# Patient Record
Sex: Female | Born: 1970 | Hispanic: No | Marital: Married
Health system: Midwestern US, Academic
[De-identification: ages and names within clinical notes are randomized; demographics above are authoritative.]

---

## 2017-02-19 MED ORDER — AZELASTINE 137 MCG (0.1 %) NA SPRA
2 | Freq: Two times a day (BID) | NASAL | 6 refills | 50.00000 days | Status: DC
Start: 2017-02-19 — End: 2018-08-12

## 2017-04-30 ENCOUNTER — Encounter: Admit: 2017-04-30 | Discharge: 2017-04-30 | Payer: BC Managed Care – PPO

## 2017-04-30 ENCOUNTER — Ambulatory Visit: Admit: 2017-04-30 | Discharge: 2017-04-30 | Payer: BC Managed Care – PPO

## 2017-04-30 DIAGNOSIS — K219 Gastro-esophageal reflux disease without esophagitis: ICD-10-CM

## 2017-04-30 DIAGNOSIS — G478 Other sleep disorders: ICD-10-CM

## 2017-04-30 DIAGNOSIS — K210 Gastro-esophageal reflux disease with esophagitis: ICD-10-CM

## 2017-04-30 DIAGNOSIS — J3081 Allergic rhinitis due to animal (cat) (dog) hair and dander: ICD-10-CM

## 2017-04-30 DIAGNOSIS — Z923 Personal history of irradiation: ICD-10-CM

## 2017-04-30 DIAGNOSIS — R51 Headache: ICD-10-CM

## 2017-04-30 DIAGNOSIS — E162 Hypoglycemia, unspecified: ICD-10-CM

## 2017-04-30 DIAGNOSIS — K2210 Ulcer of esophagus without bleeding: ICD-10-CM

## 2017-04-30 DIAGNOSIS — J3089 Other allergic rhinitis: Secondary | ICD-10-CM

## 2017-04-30 DIAGNOSIS — C50911 Malignant neoplasm of unspecified site of right female breast: Principal | ICD-10-CM

## 2017-04-30 DIAGNOSIS — J301 Allergic rhinitis due to pollen: Principal | ICD-10-CM

## 2017-04-30 NOTE — Progress Notes
Date of Service: 04/30/2017    Subjective:             Victoria Mcknight is a 46 y.o. female.    History of Present Illness    Unfortunately she has failed aggressive medical management for chronic rhinitis.    Allergy testing revealed reactions to trees, weeds, several molds, dust mite, cat, and cockroach.       Review of Systems   HENT: Positive for congestion, postnasal drip, rhinorrhea and sneezing.    Eyes: Negative.    Respiratory: Negative.    Cardiovascular: Negative.    Gastrointestinal: Negative.    Endocrine: Negative.    Genitourinary: Negative.    Musculoskeletal: Negative.    Skin: Positive for rash.   Allergic/Immunologic: Negative.    Neurological: Negative.    Hematological: Negative.    Psychiatric/Behavioral: Negative.          Objective:         ??? amoxicillin/K clavulanate (AUGMENTIN) 875/125 mg tablet Take 1 tablet by mouth every 12 hours. Take with food.   ??? azelastine(+) (ASTELIN) 137 mcg (0.1 %) nasal spray Apply 2 sprays to each nostril as directed twice daily. Use in each nostril as directed   ??? butalbital/acetaminophen/caffeine(+) (FIORICET) 50/325/40 mg tablet Take 1 Tab by mouth every 4 hours as needed for Headache.   ??? calcium citrate (CALCITRATE) 950 mg tab Take 950 mg by mouth daily.   ??? cetirizine (ZYRTEC) 10 mg tablet Take 10 mg by mouth at bedtime daily.   ??? ciprofloxacin 0.3%/dexameth 0.1% (CIPRODEX) 0.3-0.1 % otic suspension Place 4 drops in affected ear(s) as directed twice daily.   ??? dexlansoprazole (+) (DEXILANT) 60 mg capsule Take 60 mg by mouth daily.   ??? docusate (COLACE) 100 mg capsule Take 100 mg by mouth daily.   ??? Doxycycline Monohydrate (VIBRAMYCIN) 25 mg/5 mL susr Take  by mouth.   ??? erythromycin (E-MYCIN) 250 mg tablet Take 250 mg by mouth three times daily.   ??? fluticasone (FLONASE) 50 mcg/actuation nasal spray Apply  to each nostril as directed daily. Shake bottle gently before using.   ??? Glucosamine-Chondroit-Vit C-Mn (GLUCOSAMINE CHONDROITIN MAXSTR) 500-400 mg cap Take 1 Cap by mouth daily.   ??? ipratropium bromide (ATROVENT) 0.06 % nasal spray Apply 2 Sprays to each nostril as directed three times daily.   ??? Lactobacillus rhamnosus GG (LACTOBACILLUS RHAMNOSUS (GG)) 15 billion cell cpSP Take 1 Cap by mouth as directed daily with breakfast.   ??? mupirocin (BACTROBAN) 2 % topical ointment Apply  topically to affected area three times daily.   ??? PREDNISONE PO Take  by mouth.   ??? promethazine (PHENERGAN) 12.5 mg tablet Take 12.5 mg by mouth every 6 hours as needed for Nausea.   ??? tamoxifen (NOLVADEX) 20 mg tablet TAKE ONE TABLET BY MOUTH ONCE DAILY   ??? TETRAHYDROZOLINE HCL/ZINC SULF (EYE DROPS OP) Place  into or around eye(s).   ??? vitamin E 400 unit capsule Take 400 Units by mouth daily.   ??? vitamins, multiple tablet Take 1 Tab by mouth daily.     Vitals:    04/30/17 1247   BP: 111/71   Pulse: 71   Weight: 57.6 kg (127 lb)   Height: 163.8 cm (64.5)     Body mass index is 21.46 kg/m???.     Physical Exam    General appearance: well developed, well nourished, no acute distress  Communication ability: communicates by voice, normal quality  Inspection: normocephalic, no scars, lesions, or  masses  External ear: normal, no lesions or deformities  Otoscopic: canals clear, tympanic membranes intact, no fluid  Hearing: grossly intact  External nose: normal, no lesions or deformities  Palp/percussion: no sinus tenderness  Nasal: mucosa, septum, and turbinates normal  Lips/teeth/gums: normal dentition, no gingival inflammation, no labial lesions  Oropharynx: tongue normal, posterior pharynx without erythema or exudate  Pharynx wall/sinus: no saliva pooling, asymmetry, or lesions  The allergy testing site shows resolving wheal and flare responses.  No severe local or systemic reactions are noted.        Assessment and Plan:  Victoria Mcknight has significant allergy reactions on testing today.  Having failed aggressive medical therapy, I believe that she is an excellent candidate for immunotherapy.  We talked about the risks of treatment including anaphylaxis and I have prescribed an epinephrine pen today.      I reviewed that this is a 3-5 year treatment plan and that symptom improvement may take up to a year.  She is ready to proceed with injection immunotherapy.  She was carefully instructed on injection immunotherapy administration and the need for build up injections to be done in a physician's office due to the risk of anaphylaxis.  Victoria Mcknight will follow up next week for her first immunotherapy injection.

## 2017-05-04 ENCOUNTER — Encounter: Admit: 2017-05-04 | Discharge: 2017-05-04 | Payer: BC Managed Care – PPO

## 2017-05-06 ENCOUNTER — Encounter: Admit: 2017-05-06 | Discharge: 2017-05-06 | Payer: BC Managed Care – PPO

## 2017-05-06 DIAGNOSIS — J301 Allergic rhinitis due to pollen: Principal | ICD-10-CM

## 2017-05-06 MED ORDER — EPINEPHRINE 0.3 MG/0.3 ML IJ ATIN
INTRAMUSCULAR | 1 refills | 30.00000 days | Status: AC
Start: 2017-05-06 — End: ?

## 2017-05-06 NOTE — Progress Notes
Prescription for Epipen was sent to pharmacy. Called and left message for patient to call office back.

## 2017-05-08 ENCOUNTER — Encounter: Admit: 2017-05-08 | Discharge: 2017-05-08 | Payer: BC Managed Care – PPO

## 2017-05-08 ENCOUNTER — Ambulatory Visit: Admit: 2017-05-08 | Discharge: 2017-05-08 | Payer: BC Managed Care – PPO

## 2017-05-08 DIAGNOSIS — J301 Allergic rhinitis due to pollen: Principal | ICD-10-CM

## 2017-05-08 MED ORDER — IPRATROPIUM BROMIDE 42 MCG (0.06 %) NA SPRY
2 | Freq: Three times a day (TID) | NASAL | 12 refills | 25.00000 days | Status: AC
Start: 2017-05-08 — End: 2018-08-12

## 2017-05-08 NOTE — Telephone Encounter
Patient is needing a refill on her ipratropium bromide.  Ok to refill per office protocol.

## 2017-05-09 ENCOUNTER — Encounter: Admit: 2017-05-09 | Discharge: 2017-05-09 | Payer: BC Managed Care – PPO

## 2017-07-11 ENCOUNTER — Encounter: Admit: 2017-07-11 | Discharge: 2017-07-11 | Payer: BC Managed Care – PPO

## 2017-07-11 DIAGNOSIS — Z79810 Long term (current) use of selective estrogen receptor modulators (SERMs): ICD-10-CM

## 2017-07-11 DIAGNOSIS — C50411 Malignant neoplasm of upper-outer quadrant of right female breast: Principal | ICD-10-CM

## 2017-07-11 DIAGNOSIS — Z170 Estrogen receptor positive status [ER+]: ICD-10-CM

## 2017-07-11 DIAGNOSIS — G478 Other sleep disorders: ICD-10-CM

## 2017-07-11 DIAGNOSIS — K210 Gastro-esophageal reflux disease with esophagitis: ICD-10-CM

## 2017-07-11 DIAGNOSIS — R51 Headache: ICD-10-CM

## 2017-07-11 DIAGNOSIS — E162 Hypoglycemia, unspecified: ICD-10-CM

## 2017-07-11 DIAGNOSIS — Z9189 Other specified personal risk factors, not elsewhere classified: ICD-10-CM

## 2017-07-11 DIAGNOSIS — C50911 Malignant neoplasm of unspecified site of right female breast: Principal | ICD-10-CM

## 2017-07-11 DIAGNOSIS — Z923 Personal history of irradiation: ICD-10-CM

## 2017-07-11 DIAGNOSIS — K219 Gastro-esophageal reflux disease without esophagitis: ICD-10-CM

## 2017-07-11 DIAGNOSIS — K2210 Ulcer of esophagus without bleeding: ICD-10-CM

## 2017-07-11 NOTE — Progress Notes
LYMPHEDEMA DATASHEET-FOLLOW-UP    DATE:  07/11/17    PATIENT NAME:  Victoria Mcknight  DATE OF BIRTH:  Apr 10, 1971  MRN:   [id number]     Follow-Up Visit:??? 24 months  Patient Completed Questionnaire?:??? No  Lymphedema Diagnosis?:??? No  ???  LYMPHEDEMA INSTRUMENT:  ????????????????????????????????????PATIENT POPULATION:  ????????????????????????????????????????????????????????????????????????Diagnosed with cancer at [facility]???? Yes  ????????????????????????????????????????????????????????????????????????Treated for cancer at [facility]???? Yes-radiation tx in [place], [place]-Dr Goins  ????????????????????????????????????????????????????????????????????????Referred to [facility] after outside cancer treatment???? No  ????????????????????????????????????????????????????????????????????????Referred to [facility] for lymphedema management???? no  ???  ????????????????????????????????????DIAGNOSTIC DEFINITION OF LYMPHEDEMA:??? ?????????????????????????????????  Other:??? ???????????????????????????????????????????????????????????????????????????  ???  ??????  IMPACT OF LE ON ADL:  ????????????????????????????????????CHANGES IN BMI (UPON LE DIAGNOSIS):??? Stable   ????????????????????????????????????CHANGES IN ACTIVITY (CHECK ALL THAT APPLY):??? Right rsl lumpectomy SLNB (0/3) on 07/08/15  ????????????????????????????????????RECENT INFECTIONS?:??? No  ????????????????????????????????????TYPE OF INFECTION(S):??? none  ????????????????????????????????????CHANGE IN PRESCRIBED MEDICATION?:??? No  ????????????????????????????????????USE OF COMPRESSION SLEEVE:??? No  ???  FOLLOW-UP MEASUREMENTS:  ???  Baseline BMI:??? 21.84  Handedness:??? right handed  ???    Ldex Bioimpedance Score:  4.4 (-2.9)  Current Weight:  285.8 kg    Reviewed checklist with patient regarding any recent changes in health history, infections, medications, or general concerns as it relates to lymphedema.  Reinforced and verified adherence to precautions from initial visit.  No concerns or questions.  Discussed current activity level.  Has returned to previous activity level without concern.      Reviewed early signs and symptoms of lymphedema in affected arm.  None indicated.  Verified patient is aware of the early signs and symptoms to watch for as well as when to contact us.      Assessment:  L-dex: WNL    Measurements: n.a  ROM: WNL   Discussed assessment with patient.  No change in assessment to indicate presence of lymphedema. Lymphedema Stage:  Not applicable, no indication of lymphedema.     Recommendations:  Continue with precautions, meticulous skin care, weight management as well as active healthy lifestyle.  Verified no referrals indicated at this time for any issues or concerns.       Plan:  Return to clinic for routine visits based upon scheduled plan of [phone], then annual visits.  Verified patient has contact information should any questions or concerns arise.      Follow up appointment for lymphedema prevention clinic will be scheduled by patients breast surgeons team.

## 2017-07-11 NOTE — Progress Notes
Name: Victoria Mcknight          MRN: [id number]      DOB: 28-Feb-1971      AGE: 46 y.o.   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     HISTORY:  Victoria Mcknight is a Caucasian female who presented to the [facility] on 06/16/2015 at age 60 for evaluation of newly diagnosed breast cancer.  Victoria Mcknight had no breast complaints prior to screening mammogram.  Victoria Mcknight has a history of a left breast cyst aspiration in 2015 at [facility] (cytology benign).  Ultrasound guided biopsy on 06/07/15 ([place]) revealed a grade 2 invasive ductal carcinoma. Victoria Mcknight underwent right RSL lumpectomy/SLNB on 07/08/15. Final pathology revealed a 3.1 cm grade 2 IDC; DCIS present; all margins over 2 mm; LVI absent; 3 negative SLNs.  Victoria Mcknight started Tamoxifen in 07/2015.  Victoria Mcknight also took Zoladex up until the time of her BSO in 04/2016.  Victoria Mcknight completed radiation therapy with Dr. Darcus Austin in [place] on 09/29/15.    BREAST IMAGING:  Mammogram:    -- Bilateral screening mammogram 05/24/15 [facility]) revealed an extremely dense parenchymal pattern which significantly reduced sensitivity of mammography. There was subtle architectural change identified in the upper outer quadrant of the right breast best seen on tomosynthesis. The rest of the parenchyma was stable. There were scattered parenchymal calcifications. Elsewhere there was no new, dominant, nor suspicious parenchymal asymmetry, architectural change or calcification. -- Right diagnostic mammogram 06/02/15 ([place]) revealed extremely dense breast tissue, a spiculated mass within the superior lateral right breast measuring approximately 2 cm on mammography. Scattered benign calcifications were present within the right breast.    Ultrasound:    -- Right breast ultrasound 06/02/15 ([facility]) revealed at 10:00 6 cm from the nipple demonstrated hypoechoic irregular nonparallel spiculated mass measuring 2.2 cm. On one of the antiradial images this may extend to measure 3 cm.     MRI:    -- Bilateral breast MRI 06/02/15 ([place]) revealed extremely dense breast tissue.  RIGHT BREAST: There was a 1.5 cm AP x 1.9 cm transverse x 1.6 CC cm round, heterogeneously enhancing mass at 10:00, posterior depth with predominantly washout enhancement kinetics, which corresponded to the mass of concern on imaging performed yesterday. There was no abnormal enhancement of the pectoralis muscle. 0.5 cm anteroinferior to the suspicious mass, there was a 0.9 x 0.7 cm irregular enhancing mass with predominantly persistent enhancement kinetics. The total inclusive distance of these 2 discontiguous masses was 2.7 cm in AP dimension. No suspicious right axillary or internal mammary lymph nodes were seen. LEFT BREAST: There was no evidence of abnormal enhancement, mass, or axillary/internal mammary adenopathy.    REPRODUCTIVE HEALTH:  Age at first Menarche:  51  Age at First Live Birth:  N/A  Age at Menopause: surgical menopause  at 52; s/p hysterectomy in 2009 and BSO in 2017  Gravida:  0  Para: 0  Breastfeeding:  N/A    PROCEDURES: Right RSL lumpectomy/SLNB, 07/08/15  PERTINENT PMH:  None  FAMILY HISTORY:  Mother diagnosed with breast cancer at age 71; Maternal aunt diagnosed with breast cancer in her 3's; Maternal aunt diagnosed with breast cancer in her 44's  PHYSICAL EXAM on PRESENTATION: Biopsy site changes and hematoma at 10:00 right breast.  Dense breast tissue with benign nodularity noted at 6:00 in the left breast.  No supraclavicular or axillary adenopathy.    MEDICAL ONCOLOGY:  Dr. Welton Flakes  PRESENT THERAPY: Tamoxifen started 07/2015; Zoladex was stopped following BSO in July 2017  REFERRED BY:  Dr. Burnadette Pop         Review of Systems    Review of 13 systems negative except for:    muscle cramps    Allergies   Allergen Reactions   ??? Decongestant [Pseudoephedrine Hcl] SEE COMMENTS     Tachycardia   ??? Feathers EYE IRRITATION   ??? Mold ITCHING   ??? Naproxen SEE COMMENTS     inner ear crystal formation       The following medical/surgical/family/social history and the list of medications are current, as of 07/11/2017    Past Medical History:   Diagnosis Date   ??? Bile reflux esophagitis    ??? Generalized headaches    ??? GERD (gastroesophageal reflux disease)    ??? History of radiation therapy    ??? Hypoglycemia    ??? Malignant neoplasm of right breast (HCC) 06/07/15   ??? Ulcer of esophagus    ??? Upper airway resistance syndrome      Past Surgical History:   Procedure Laterality Date   ??? BUNIONECTOMY Bilateral 2007   ??? HX HYSTERECTOMY  2009   ??? CHOLECYSTECTOMY  2015   ??? BREAST BIOPSY Right 06/07/2015   ??? PR MASTECTOMY PARTIAL Right 07/08/2015    Right radioactive seed localized lumpectomy, sentinel lymph node biopsy Radiotracer to be injected in OR by Dr. Nelson Chimes at 0900  performed by Cordelia Poche, MD at [facility] MAIN OR/PERIOP   ??? BILATERAL SALPINGO-OOPHORECTOMY  2017   ??? PR LAPAROSCOPY W/RMVL ADNEXAL STRUCTURES Bilateral 04/17/2016    LAPAROSCOPIC BILATERAL SALPINGO-OOPHORECTOMY  performed by Pernell Dupre, MD at Main OR/Periop     Family History   Problem Relation Age of Onset   ??? Cancer Mother 69        Breast cancer   ??? Diabetes Mother    ??? Cancer-Breast Mother    ??? Cancer Other 20        M-aunt breast cancer   ??? Cancer-Breast Other    ??? Cancer Other 67        M-aunt breast cancer   ??? Cancer-Breast Other    ??? Heart Attack Neg Hx    ??? Stroke Neg Hx    ??? Cervical Cancer Neg Hx    ??? Cancer-Colon Neg Hx    ??? Cancer-Ovarian Neg Hx ??? Heart Disease Neg Hx    ??? Hypertension Neg Hx      Social History     Social History   ??? Marital status: Married     Spouse name: N/A   ??? Number of children: N/A   ??? Years of education: N/A     Social History Main Topics   ??? Smoking status: Never Smoker   ??? Smokeless tobacco: Never Used      Comment:  5.11.17   ??? Alcohol use No   ??? Drug use: No   ??? Sexual activity: Yes     Partners: Male     Other Topics Concern   ??? Not on file     Social History Narrative   ??? No narrative on file         Objective:         ??? amoxicillin/K clavulanate (AUGMENTIN) 875/125 mg tablet Take 1 tablet by mouth every 12 hours. Take with food.   ??? azelastine(+) (ASTELIN) 137 mcg (0.1 %) nasal spray Apply 2 sprays to each nostril as directed twice daily. Use in each nostril as directed   ??? butalbital/acetaminophen/caffeine(+) (FIORICET) 50/325/40 mg tablet Take 1 Tab by mouth every 4 hours as needed for Headache.   ??? calcium citrate (CALCITRATE) 950 mg tab Take 950 mg by mouth daily.   ??? cetirizine (ZYRTEC) 10 mg tablet Take 10 mg by mouth at bedtime daily.   ??? ciprofloxacin 0.3%/dexameth 0.1% (CIPRODEX) 0.3-0.1 % otic suspension Place 4 drops in affected ear(s) as directed twice daily.   ??? dexlansoprazole (+) (DEXILANT) 60 mg capsule Take 60 mg by mouth daily.   ??? docusate (COLACE) 100 mg capsule Take 100 mg by mouth daily.   ??? Doxycycline Monohydrate (VIBRAMYCIN) 25 mg/5 mL susr Take  by mouth.   ??? EPINEPHrine (EPIPEN 2-PAK) 1 mg/mL injection pen (2-Pack) Inject 0.3 mg (1 Pen) into thigh if needed for anaphylactic reaction. May repeat in 5-15 minutes if needed.   ??? erythromycin (E-MYCIN) 250 mg tablet Take 250 mg by mouth three times daily.   ??? fluticasone (FLONASE) 50 mcg/actuation nasal spray Apply  to each nostril as directed daily. Shake bottle gently before using.   ??? Glucosamine-Chondroit-Vit C-Mn (GLUCOSAMINE CHONDROITIN MAXSTR) 500-400 mg cap Take 1 Cap by mouth daily. ??? ipratropium bromide (ATROVENT) 42 mcg (0.06 %) nasal spray Apply 2 sprays to each nostril as directed three times daily.   ??? Lactobacillus rhamnosus GG (LACTOBACILLUS RHAMNOSUS (GG)) 15 billion cell cpSP Take 1 Cap by mouth as directed daily with breakfast.   ??? mupirocin (BACTROBAN) 2 % topical ointment Apply  topically to affected area three times daily.   ??? PREDNISONE PO Take  by mouth.   ??? promethazine (PHENERGAN) 12.5 mg tablet Take 12.5 mg by mouth every 6 hours as needed for Nausea.   ??? tamoxifen (NOLVADEX) 20 mg tablet TAKE ONE TABLET BY MOUTH ONCE DAILY   ??? TETRAHYDROZOLINE HCL/ZINC SULF (EYE DROPS OP) Place  into or around eye(s).   ??? vitamin E 400 unit capsule Take 400 Units by mouth daily.   ??? vitamins, multiple tablet Take 1 Tab by mouth daily.     Vitals:    07/11/17 0917   BP: 124/79   Pulse: 88   Resp: 18   Temp: 36.5 ???C (97.7 ???F)   TempSrc: Oral   SpO2: 97%   Weight: 58.8 kg (129 lb 9.6 oz)   Height: 163.8 cm (64.5)     Body mass index is 21.9 kg/m???.     Pain Score: Zero         Pain Addressed:  N/A    Patient Evaluated for a Clinical Trial: No treatment clinical trial available for this patient.     [place] Cooperative Oncology Group performance status is 0, Fully active, able to carry on all pre-disease performance without restriction.[name]     Physical Exam   Pulmonary/Chest:       Vitals reviewed.  RIGHT BREAST EXAM:  Breast:  Exam consistent with lumpectomy/SLNB. Minimal radiation skin changes. No palpable masses  Skin Erythema:  No  Attachment of Overlying Skin:  No  Peau d' orange:  No  Chest Wall Attachment:  No  Nipple Inversion:  No  Nipple Discharge: No    LEFT BREAST EXAM:  Breast: No palpable masses  Skin Erythema:  No  Attachment of Overlying Skin:  No  Peau d' orange:  No  Chest Wall Attachment: No  Nipple Inversion:  No  Nipple Discharge:  No    RIGHT NODAL BASIN EXAM:  Axillary:  negative  Infraclavicular:  negative  Supraclavicular:  negative    LEFT NODAL BASIN EXAM: Axillary:  negative  Infraclavicular: negative  Supraclavicular:  negative    Constitutional: No acute distress.  HEENT:  Head: Normocephalic and atraumatic.  Eyes: No discharge. No scleral icterus.  Pulmonary/Chest: No respiratory distress.   Neurological: Alert and oriented to person, place and time. No cranial nerve deficit.  Skin: Warm and dry. No rash noted. No erythema. No pallor.  Psychiatric: Normal mood and affect. Behavior is normal. Judgement and thought content normal.              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 Victoria Mcknight is on long term erythromycin for GERD. Victoria Mcknight has started allergy shots, but otherwise denies any changes in her medical or family history. Victoria Mcknight has no breast complaints. Victoria Mcknight continues on tamoxifen with Dr. Welton Flakes and is tolerating it. Victoria Mcknight reports occasional muscle cramps. This could be related to the tamoxifen and we discussed trying OTC magnesium or CoQ10. Victoria Mcknight had a follow up in the [facility]ymphedema Clinic today and had no evidence of arm lymphedema. Victoria Mcknight will be due for bilateral screening mammogram and ultrasound in 6 months. I will plan to see her at that time for clinical exam. Victoria Mcknight was given ample time to ask questions all of which were answered to her satisfaction. Victoria Mcknight was encouraged to call with any interval questions or concerns.    1. Continue follow up with Dr. Welton Flakes  2. RTC in 6 months with bilateral screening mammogram and ultrasound    Dimas Alexandria, PA-C

## 2017-07-12 ENCOUNTER — Encounter: Admit: 2017-07-12 | Discharge: 2017-07-12 | Payer: BC Managed Care – PPO

## 2017-07-12 NOTE — Telephone Encounter
Called patient because she is on my list of patients that may need more allergy vials.  Patient is in need of allergy vials and will pick them up at [facility].

## 2017-07-15 ENCOUNTER — Encounter: Admit: 2017-07-15 | Discharge: 2017-07-15 | Payer: BC Managed Care – PPO

## 2017-07-15 DIAGNOSIS — J301 Allergic rhinitis due to pollen: Principal | ICD-10-CM

## 2017-07-15 NOTE — Progress Notes
Patient notified that allergy vials are ready for pick up at [facility].

## 2017-08-06 ENCOUNTER — Encounter: Admit: 2017-08-06 | Discharge: 2017-08-06 | Payer: BC Managed Care – PPO

## 2017-08-06 DIAGNOSIS — Z923 Personal history of irradiation: ICD-10-CM

## 2017-08-06 DIAGNOSIS — F519 Sleep disorder not due to a substance or known physiological condition, unspecified: ICD-10-CM

## 2017-08-06 DIAGNOSIS — C50911 Malignant neoplasm of unspecified site of right female breast: Principal | ICD-10-CM

## 2017-08-06 DIAGNOSIS — Z170 Estrogen receptor positive status [ER+]: ICD-10-CM

## 2017-08-06 DIAGNOSIS — K2210 Ulcer of esophagus without bleeding: ICD-10-CM

## 2017-08-06 DIAGNOSIS — C50411 Malignant neoplasm of upper-outer quadrant of right female breast: Secondary | ICD-10-CM

## 2017-08-06 DIAGNOSIS — K210 Gastro-esophageal reflux disease with esophagitis: ICD-10-CM

## 2017-08-06 DIAGNOSIS — G478 Other sleep disorders: ICD-10-CM

## 2017-08-06 DIAGNOSIS — R51 Headache: ICD-10-CM

## 2017-08-06 DIAGNOSIS — E162 Hypoglycemia, unspecified: ICD-10-CM

## 2017-08-06 DIAGNOSIS — K219 Gastro-esophageal reflux disease without esophagitis: ICD-10-CM

## 2017-08-06 DIAGNOSIS — G4762 Sleep related leg cramps: ICD-10-CM

## 2017-08-06 DIAGNOSIS — Z79810 Long term (current) use of selective estrogen receptor modulators (SERMs): ICD-10-CM

## 2017-08-06 NOTE — Progress Notes
Date of Service: 08/06/2017      Subjective:             Reason for Visit:  Heme/Onc Care      Victoria Mcknight is a 46 y.o. female.    Cancer Staging  Malignant neoplasm of upper-outer quadrant of right breast in female, estrogen receptor positive (HCC)  Staging form: Breast, AJCC 7th Edition  - Clinical: Stage IA (T1c, N0, cM0) - Signed by Judye Bos, PA-C on 06/15/2015  - Pathologic stage from 07/19/2015: Stage IIA (T2, N0, cM0) - Signed by Dimas Alexandria, PA-C on 07/19/2015      History of Present Illness    DIAGNOSIS: Right breast cancer     PAST ONCOLOGY HISTORY: Victoria Mcknight is a 46 year old female who underwent a routine bilateral screening mammogram on 05/24/15 [facility]) that revealed mass in upper outer quadrant of the right breast. On 06/02/15 she underwent a right mammogram ([place]) that revealed 2cm mass. Right ultrasound ([place])revealed at 10:00 6 cm from the   nipple demonstrates hypoechoic irregular nonparallel spiculated mass measuring 2.2 cm. On one of the antiradial images this may extend to measure 3 cm. This can be further characterized on MRI. Biopsy of this right breast mass will be needed.  MRI breast on 06/02/15 ([place]) revealed in the right breast a 1.5 cm AP x 1.9 cm transverse x 1.6 CC cm round, heterogeneously enhancing mass at 10:00, posterior depth with predominantly washout enhancement kinetics, which corresponds to the mass of concern. There is no abnormal enhancement of the pectoralis muscle. 0.5 cm anteroinferior to the suspicious mass, there is a 0.9 x 0.7 cm irregular enhancing mass with predominantly persistent enhancement kinetics. The total inclusive distance of these 2 discontiguous masses is 2.7 cm in AP dimension. No suspicious right axillary or internal mammary lymph nodes are seen. In the left breast there is no evidence of abnormal enhancement, mass, or axillary/internal mammary adenopathy. On 06/07/15 ([place]) she underwent a right breast biopsy that revealed IDC, grade II, ER 99%, PR 100%, HER2 1+ IHC, FISH 1.1, Ki67 18%. On 07/08/15 she underwent a right lumpectomy and SLNB. Pathology revealed IDC, grade II, measuring 3.1cm, 0 LN involved (0/3). Oncotype DX score 11. Her 10-year risk of distant recurrence with Tamoxifen is estimated at 7%.     OB/GYN HISTORY: Age at onset of menstruation 12-12. G0P0. She took birth control from age [age]. LMP 2009 with hysterectomy. Bilateral ovaries intact. She never took HRT.     PRESENT THERAPY: Tamoxifen 20mg  daily 07/2015    Interval History:   Victoria Mcknight is a 46 year old female who presents for routine follow up of resected right sided hormone positive, her2 negative IDC, grade II. She has been on tamoxifen now for two years. She denies new lumps or bumps. She denies weight loss. Overall she feels very well. 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 She has occasionally calf cramping.        Review of Systems   Constitutional: Negative for appetite change, fatigue, fever and unexpected weight change.        No hot flashes    HENT: Negative.  Negative for mouth sores and sore throat.    Eyes: Negative.  Negative for visual disturbance.   Respiratory: Negative.  Negative for cough, shortness of breath and wheezing.    Cardiovascular: Negative.  Negative for chest pain and palpitations.   Gastrointestinal: Negative.  Negative for abdominal distention, abdominal pain, anal  bleeding, constipation, diarrhea, nausea and vomiting.   Genitourinary: Negative for hematuria, vaginal bleeding and vaginal discharge.        Post Menopausal   LMP 2009 S/P hysterectomy   BSO 04/17/16   Musculoskeletal: Negative for arthralgias, back pain and myalgias.   Skin: Negative for rash.   Neurological: Negative.  Negative for dizziness, numbness and headaches.   Hematological: Negative for adenopathy.   Psychiatric/Behavioral: Negative.    All other systems reviewed and are negative.        Allergies   Allergen Reactions ??? Decongestant [Pseudoephedrine Hcl] SEE COMMENTS     Tachycardia   ??? Feathers EYE IRRITATION   ??? Mold ITCHING   ??? Naproxen SEE COMMENTS     inner ear crystal formation       Objective:         ??? azelastine(+) (ASTELIN) 137 mcg (0.1 %) nasal spray Apply 2 sprays to each nostril as directed twice daily. Use in each nostril as directed   ??? butalbital/acetaminophen/caffeine(+) (FIORICET) 50/325/40 mg tablet Take 1 Tab by mouth every 4 hours as needed for Headache.   ??? calcium citrate (CALCITRATE) 950 mg tab Take 950 mg by mouth daily.   ??? cetirizine (ZYRTEC) 10 mg tablet Take 10 mg by mouth at bedtime daily.   ??? dexlansoprazole (+) (DEXILANT) 60 mg capsule Take 60 mg by mouth daily.   ??? docusate (COLACE) 100 mg capsule Take 100 mg by mouth daily.   ??? EPINEPHrine (EPIPEN 2-PAK) 1 mg/mL injection pen (2-Pack) Inject 0.3 mg (1 Pen) into thigh if needed for anaphylactic reaction. May repeat in 5-15 minutes if needed.   ??? erythromycin (E-MYCIN) 250 mg tablet Take 250 mg by mouth three times daily.   ??? fluticasone (FLONASE) 50 mcg/actuation nasal spray Apply  to each nostril as directed daily. Shake bottle gently before using.   ??? Glucosamine-Chondroit-Vit C-Mn (GLUCOSAMINE CHONDROITIN MAXSTR) 500-400 mg cap Take 1 Cap by mouth daily.   ??? ipratropium bromide (ATROVENT) 42 mcg (0.06 %) nasal spray Apply 2 sprays to each nostril as directed three times daily.   ??? Lactobacillus rhamnosus GG (LACTOBACILLUS RHAMNOSUS (GG)) 15 billion cell cpSP Take 1 Cap by mouth as directed daily with breakfast.   ??? mupirocin (BACTROBAN) 2 % topical ointment Apply  topically to affected area three times daily.   ??? promethazine (PHENERGAN) 12.5 mg tablet Take 12.5 mg by mouth every 6 hours as needed for Nausea.   ??? tamoxifen (NOLVADEX) 20 mg tablet TAKE ONE TABLET BY MOUTH ONCE DAILY   ??? TETRAHYDROZOLINE HCL/ZINC SULF (EYE DROPS OP) Place  into or around eye(s).   ??? vitamin E 400 unit capsule Take 400 Units by mouth daily. ??? vitamins, multiple tablet Take 1 Tab by mouth daily.       Body mass index is 21.73 kg/m???.     Pain Score: Zero         Pain Addressed:  N/A    Patient Evaluated for a Clinical Trial: No treatment clinical trial available for this patient.     [place] Cooperative Oncology Group performance status is 0, Fully active, able to carry on all pre-disease performance without restriction.[name]     Physical Exam   Constitutional: She is oriented to person, place, and time. She appears well-developed and well-nourished.   HENT:   Head: Normocephalic.   Right Ear: External ear normal.   Mouth/Throat: No oropharyngeal exudate.   Eyes: Pupils are equal, round, and reactive to light. Conjunctivae are  normal.   Neck: Normal range of motion. Neck supple.   Cardiovascular: Normal rate, regular rhythm and normal heart sounds.    No murmur heard.  Pulmonary/Chest: Effort normal and breath sounds normal. No respiratory distress. She has no wheezes. She has no rales. She exhibits no tenderness. Right breast exhibits no inverted nipple, no mass, no nipple discharge, no skin change and no tenderness. Left breast exhibits no inverted nipple, no mass, no nipple discharge, no skin change and no tenderness.       Abdominal: Soft. Bowel sounds are normal. She exhibits no distension and no mass. There is no tenderness. There is no rebound and no guarding.   Musculoskeletal: Normal range of motion. She exhibits no edema or tenderness.   Lymphadenopathy:     She has no cervical adenopathy.     She has no axillary adenopathy.        Right: No supraclavicular adenopathy present.        Left: No supraclavicular adenopathy present.   Neurological: She is alert and oriented to person, place, and time.   Skin: Skin is warm and dry. No rash noted.   Psychiatric: She has a normal mood and affect. Her behavior is normal.   Nursing note and vitals reviewed.    RADIOLOGY:  Bilateral mammogram 01/17/16: BIRAD 2-benign Bilateral mammogram 01/17/17: BIRAD 2-benign          Assessment and Plan:  1. Victoria Mcknight is a 46 year old female with right breast IDC, grade 2, ER/PR + and HER2 negative.  2. We have reviewed the patient's radiology and pathology results in detail with her and her husband.  We have reviewed the particular features of her cancer including stage, grade, hormone receptor and HER2 neu status.   3. Due to family history; genetic testing. Negative.  4. Oncotype DX score 11. Her 10-year risk of distant recurrence with Tamoxifen is estimated at 7%.  We are not recommending chemotherapy at this time. We are recommending 5-10 years of endocrine therapy. She was started on Tamoxifen 20mg  daily, 07/2015. Estradiol was elevated so we recommended Zoladex every 3 months for 5 years. BSO 03/2016; so we stopped Zoladex.   5. Completed radiation in [place] with Dr Darcus Austin 09/29/15.   6. Bilateral mammogram due 12/2017, she has follow up with Dr. Nelson Chimes at that time.   7. Tonic water for leg cramps.  8. RTC in 6 months.     Patient seen and examined with Dr. Welton Flakes.     Dwaine Gale, DO  *[phone]    ATTESTATION    I personally performed the key portions of the E/M visit, discussed case with Dr. Dwaine Gale, fellow in hematology and oncology, and concur with his documentation of history, physical exam, assessment, and treatment plan unless otherwise noted.    Staff name:  Marygrace Drought, MD Date:  08/18/2017

## 2017-08-16 ENCOUNTER — Encounter: Admit: 2017-08-16 | Discharge: 2017-08-16 | Payer: BC Managed Care – PPO

## 2017-08-20 ENCOUNTER — Encounter: Admit: 2017-08-20 | Discharge: 2017-08-20 | Payer: BC Managed Care – PPO

## 2017-08-20 MED ORDER — TAMOXIFEN 20 MG PO TAB
ORAL_TABLET | Freq: Every day | 3 refills | Status: AC
Start: 2017-08-20 — End: 2018-08-05

## 2017-08-29 ENCOUNTER — Encounter: Admit: 2017-08-29 | Discharge: 2017-08-29 | Payer: BC Managed Care – PPO

## 2017-08-29 ENCOUNTER — Ambulatory Visit: Admit: 2017-08-29 | Discharge: 2017-08-29 | Payer: BC Managed Care – PPO

## 2017-08-29 DIAGNOSIS — K210 Gastro-esophageal reflux disease with esophagitis: ICD-10-CM

## 2017-08-29 DIAGNOSIS — K219 Gastro-esophageal reflux disease without esophagitis: ICD-10-CM

## 2017-08-29 DIAGNOSIS — Z01419 Encounter for gynecological examination (general) (routine) without abnormal findings: Principal | ICD-10-CM

## 2017-08-29 DIAGNOSIS — C50911 Malignant neoplasm of unspecified site of right female breast: Principal | ICD-10-CM

## 2017-08-29 DIAGNOSIS — R51 Headache: ICD-10-CM

## 2017-08-29 DIAGNOSIS — E162 Hypoglycemia, unspecified: ICD-10-CM

## 2017-08-29 DIAGNOSIS — G478 Other sleep disorders: ICD-10-CM

## 2017-08-29 DIAGNOSIS — Z923 Personal history of irradiation: ICD-10-CM

## 2017-08-29 DIAGNOSIS — K2210 Ulcer of esophagus without bleeding: ICD-10-CM

## 2017-08-29 NOTE — Progress Notes
Date of Service: 08/29/2017    Subjective:             Victoria Mcknight is a 46 y.o. female.    History of Present Illness  Victoria Mcknight 46 y.o. G0P0000 who presents for WWE.    Had a hyst in 2009 for adenomyosis.  In 2016 had a lumpectomy for breast ca and is now on Tamoxifen since 2016.  Had BSO last year.      Contraception method status post hysterectomy  Pap: was normal  History of abnormal Paps - denies  Mammo: follows with breast clinic      Past Medical History:   Diagnosis Date   ??? Bile reflux esophagitis    ??? Generalized headaches    ??? GERD (gastroesophageal reflux disease)    ??? History of radiation therapy    ??? Hypoglycemia    ??? Malignant neoplasm of right breast (HCC) 06/07/15   ??? Ulcer of esophagus    ??? Upper airway resistance syndrome        Past Surgical History:   Procedure Laterality Date   ??? BUNIONECTOMY Bilateral 2007   ??? HX HYSTERECTOMY  2009   ??? CHOLECYSTECTOMY  2015   ??? BREAST BIOPSY Right 06/07/2015   ??? PR MASTECTOMY PARTIAL Right 07/08/2015    Right radioactive seed localized lumpectomy, sentinel lymph node biopsy Radiotracer to be injected in OR by Dr. Nelson Chimes at 0900  performed by Cordelia Poche, MD at [facility] OR[facility]   ??? BILATERAL SALPINGO-OOPHORECTOMY  2017   ??? PR LAPAROSCOPY W/RMVL ADNEXAL STRUCTURES Bilateral 04/17/2016    LAPAROSCOPIC BILATERAL SALPINGO-OOPHORECTOMY  performed by Pernell Dupre, MD at [facility] OR/[facility]       Obstetric History    G0   P0   T0   P0   A0   L0     SAB0   TAB0   Ectopic0   Multiple0   Live Births0        Social History   Substance Use Topics   ??? Smoking status: Never Smoker   ??? Smokeless tobacco: Never Used      Comment: 5.11.17   ??? Alcohol use No       Family History   Problem Relation Age of Onset   ??? Cancer Mother 51        Breast cancer   ??? Diabetes Mother    ??? Cancer-Breast Mother    ??? Cancer Other 64        M-aunt breast cancer   ??? Cancer-Breast Other    ??? Cancer Other 23        M-aunt breast cancer   ??? Cancer-Breast Other    ??? Heart Attack Neg Hx ??? Stroke Neg Hx    ??? Cervical Cancer Neg Hx    ??? Cancer-Colon Neg Hx    ??? Cancer-Ovarian Neg Hx    ??? Heart Disease Neg Hx    ??? Hypertension Neg Hx        Allergies:  Allergies   Allergen Reactions   ??? Decongestant [Pseudoephedrine Hcl] SEE COMMENTS     Tachycardia   ??? Feathers EYE IRRITATION   ??? Mold ITCHING   ??? Naproxen SEE COMMENTS     inner ear crystal formation        Review of Systems   Constitutional: Negative for fatigue, fever and unexpected weight change.   HENT: Negative for voice change.    Respiratory: Negative for cough and shortness of breath.  Cardiovascular: Negative for chest pain and leg swelling.   Gastrointestinal: Positive for constipation. Negative for abdominal pain, blood in stool, diarrhea, nausea and vomiting.   Genitourinary: Negative for difficulty urinating, dyspareunia, dysuria, enuresis, frequency, genital sores, hematuria, menstrual problem, pelvic pain, urgency, vaginal bleeding, vaginal discharge and vaginal pain.   Musculoskeletal: Negative for arthralgias and back pain.   Skin: Negative for rash.   Neurological: Negative for light-headedness and headaches.   Hematological: Negative for adenopathy. Does not bruise/bleed easily.   Psychiatric/Behavioral: Negative for confusion. The patient is not nervous/anxious.          Objective:         ??? azelastine(+) (ASTELIN) 137 mcg (0.1 %) nasal spray Apply 2 sprays to each nostril as directed twice daily. Use in each nostril as directed   ??? butalbital/acetaminophen/caffeine(+) (FIORICET) 50/325/40 mg tablet Take 1 Tab by mouth every 4 hours as needed for Headache.   ??? calcium citrate (CALCITRATE) 950 mg tab Take 950 mg by mouth daily.   ??? cetirizine (ZYRTEC) 10 mg tablet Take 10 mg by mouth at bedtime daily.   ??? dexlansoprazole (+) (DEXILANT) 60 mg capsule Take 60 mg by mouth daily.   ??? docusate (COLACE) 100 mg capsule Take 100 mg by mouth daily.   ??? EPINEPHrine (EPIPEN 2-PAK) 1 mg/mL injection pen (2-Pack) Inject 0.3 mg (1 Pen) into thigh if needed for anaphylactic reaction. May repeat in 5-15 minutes if needed.   ??? erythromycin (E-MYCIN) 250 mg tablet Take 250 mg by mouth three times daily.   ??? fluticasone (FLONASE) 50 mcg/actuation nasal spray Apply  to each nostril as directed daily. Shake bottle gently before using.   ??? Glucosamine-Chondroit-Vit C-Mn (GLUCOSAMINE CHONDROITIN MAXSTR) 500-400 mg cap Take 1 Cap by mouth daily.   ??? ipratropium bromide (ATROVENT) 42 mcg (0.06 %) nasal spray Apply 2 sprays to each nostril as directed three times daily.   ??? Lactobacillus rhamnosus GG (LACTOBACILLUS RHAMNOSUS (GG)) 15 billion cell cpSP Take 1 Cap by mouth as directed daily with breakfast.   ??? mupirocin (BACTROBAN) 2 % topical ointment Apply  topically to affected area three times daily.   ??? promethazine (PHENERGAN) 12.5 mg tablet Take 12.5 mg by mouth every 6 hours as needed for Nausea.   ??? tamoxifen (NOLVADEX) 20 mg tablet TAKE ONE TABLET BY MOUTH ONCE DAILY   ??? TETRAHYDROZOLINE HCL/ZINC SULF (EYE DROPS OP) Place  into or around eye(s).   ??? vitamin E 400 unit capsule Take 400 Units by mouth daily.   ??? vitamins, multiple tablet Take 1 Tab by mouth daily.     Vitals:    08/29/17 1255   BP: 121/74   Pulse: 92   Weight: 58.7 kg (129 lb 6.4 oz)   Height: 162.6 cm (64)     Body mass index is 22.21 kg/m???.     Physical Exam    General:     NAD   HEENT:  Normocephalic   Thyroid:  normal to inspection and palpation   Lymph Nodes:  Supraclavicular, and  nodes normal.   Lungs:  Effort normal.    Heart:  Normal rate and intact distal pulses.     Abdomen:  Soft. She exhibits no distension and no mass. There is no tenderness. There is no rebound and no guarding.    Neurologic:  oriented, normal mood   Vulva:  No Lesions   GU:  normal   Vagina:  Normal mucosa, no discharge   Cervix:  absent  Uterus:  absent   Left Adnexa:  No masses, nodularity, tenderness   Right Adnexa:  No masses, nodularity, tenderness   Rectovaginal:  Deferred Breast: No masses, nipple discharge, or skin changes bilaterally         Assessment and Plan:  WWE-  S/p hyst BSO- no pap indicated  Breast ca- follows with breast    Carleene Overlie, MD

## 2017-10-02 ENCOUNTER — Encounter: Admit: 2017-10-02 | Discharge: 2017-10-02 | Payer: BC Managed Care – PPO

## 2017-10-07 ENCOUNTER — Encounter: Admit: 2017-10-07 | Discharge: 2017-10-07 | Payer: BC Managed Care – PPO

## 2017-10-07 DIAGNOSIS — J301 Allergic rhinitis due to pollen: Principal | ICD-10-CM

## 2017-10-07 NOTE — Progress Notes
Patient notified via MyChart that her allergy vials are ready for p/u at [facility].

## 2017-12-18 ENCOUNTER — Encounter: Admit: 2017-12-18 | Discharge: 2017-12-18 | Payer: BC Managed Care – PPO

## 2017-12-27 ENCOUNTER — Encounter: Admit: 2017-12-27 | Discharge: 2017-12-27 | Payer: BC Managed Care – PPO

## 2017-12-27 DIAGNOSIS — J301 Allergic rhinitis due to pollen: Principal | ICD-10-CM

## 2018-01-16 ENCOUNTER — Ambulatory Visit: Admit: 2018-01-16 | Discharge: 2018-01-17 | Payer: BC Managed Care – PPO

## 2018-01-16 ENCOUNTER — Encounter: Admit: 2018-01-16 | Discharge: 2018-01-16 | Payer: BC Managed Care – PPO

## 2018-01-16 ENCOUNTER — Ambulatory Visit: Admit: 2018-01-16 | Discharge: 2018-01-16 | Payer: BC Managed Care – PPO

## 2018-01-16 DIAGNOSIS — R51 Headache: ICD-10-CM

## 2018-01-16 DIAGNOSIS — Z170 Estrogen receptor positive status [ER+]: ICD-10-CM

## 2018-01-16 DIAGNOSIS — C50911 Malignant neoplasm of unspecified site of right female breast: Principal | ICD-10-CM

## 2018-01-16 DIAGNOSIS — R922 Inconclusive mammogram: ICD-10-CM

## 2018-01-16 DIAGNOSIS — K2210 Ulcer of esophagus without bleeding: ICD-10-CM

## 2018-01-16 DIAGNOSIS — Z923 Personal history of irradiation: ICD-10-CM

## 2018-01-16 DIAGNOSIS — C50411 Malignant neoplasm of upper-outer quadrant of right female breast: Principal | ICD-10-CM

## 2018-01-16 DIAGNOSIS — Z1231 Encounter for screening mammogram for malignant neoplasm of breast: ICD-10-CM

## 2018-01-16 DIAGNOSIS — G478 Other sleep disorders: ICD-10-CM

## 2018-01-16 DIAGNOSIS — E162 Hypoglycemia, unspecified: ICD-10-CM

## 2018-01-16 DIAGNOSIS — K219 Gastro-esophageal reflux disease without esophagitis: ICD-10-CM

## 2018-01-16 DIAGNOSIS — K210 Gastro-esophageal reflux disease with esophagitis: ICD-10-CM

## 2018-01-23 ENCOUNTER — Encounter: Admit: 2018-01-23 | Discharge: 2018-01-23 | Payer: BC Managed Care – PPO

## 2018-01-23 DIAGNOSIS — G478 Other sleep disorders: ICD-10-CM

## 2018-01-23 DIAGNOSIS — K2210 Ulcer of esophagus without bleeding: ICD-10-CM

## 2018-01-23 DIAGNOSIS — C50911 Malignant neoplasm of unspecified site of right female breast: Principal | ICD-10-CM

## 2018-01-23 DIAGNOSIS — K210 Gastro-esophageal reflux disease with esophagitis: ICD-10-CM

## 2018-01-23 DIAGNOSIS — E162 Hypoglycemia, unspecified: ICD-10-CM

## 2018-01-23 DIAGNOSIS — K219 Gastro-esophageal reflux disease without esophagitis: ICD-10-CM

## 2018-01-23 DIAGNOSIS — R51 Headache: ICD-10-CM

## 2018-01-23 DIAGNOSIS — Z923 Personal history of irradiation: ICD-10-CM

## 2018-02-04 ENCOUNTER — Encounter: Admit: 2018-02-04 | Discharge: 2018-02-04 | Payer: BC Managed Care – PPO

## 2018-02-04 DIAGNOSIS — Z923 Personal history of irradiation: ICD-10-CM

## 2018-02-04 DIAGNOSIS — K210 Gastro-esophageal reflux disease with esophagitis: ICD-10-CM

## 2018-02-04 DIAGNOSIS — K2210 Ulcer of esophagus without bleeding: ICD-10-CM

## 2018-02-04 DIAGNOSIS — C50411 Malignant neoplasm of upper-outer quadrant of right female breast: Principal | ICD-10-CM

## 2018-02-04 DIAGNOSIS — C50911 Malignant neoplasm of unspecified site of right female breast: ICD-10-CM

## 2018-02-04 DIAGNOSIS — E162 Hypoglycemia, unspecified: ICD-10-CM

## 2018-02-04 DIAGNOSIS — R51 Headache: ICD-10-CM

## 2018-02-04 DIAGNOSIS — G478 Other sleep disorders: ICD-10-CM

## 2018-02-04 DIAGNOSIS — Z170 Estrogen receptor positive status [ER+]: ICD-10-CM

## 2018-02-04 DIAGNOSIS — K219 Gastro-esophageal reflux disease without esophagitis: ICD-10-CM

## 2018-02-05 ENCOUNTER — Encounter: Admit: 2018-02-05 | Discharge: 2018-02-05 | Payer: BC Managed Care – PPO

## 2018-03-07 ENCOUNTER — Encounter: Admit: 2018-03-07 | Discharge: 2018-03-07 | Payer: BC Managed Care – PPO

## 2018-03-11 ENCOUNTER — Encounter: Admit: 2018-03-11 | Discharge: 2018-03-11 | Payer: BC Managed Care – PPO

## 2018-03-20 ENCOUNTER — Encounter: Admit: 2018-03-20 | Discharge: 2018-03-20 | Payer: BC Managed Care – PPO

## 2018-03-20 DIAGNOSIS — J300 Vasomotor rhinitis: Principal | ICD-10-CM

## 2018-06-06 ENCOUNTER — Encounter: Admit: 2018-06-06 | Discharge: 2018-06-06 | Payer: BC Managed Care – PPO

## 2018-06-09 ENCOUNTER — Encounter: Admit: 2018-06-09 | Discharge: 2018-06-09 | Payer: BC Managed Care – PPO

## 2018-06-09 DIAGNOSIS — J300 Vasomotor rhinitis: Principal | ICD-10-CM

## 2018-06-22 ENCOUNTER — Encounter: Admit: 2018-06-22 | Discharge: 2018-06-22 | Payer: BC Managed Care – PPO

## 2018-07-19 ENCOUNTER — Encounter: Admit: 2018-07-19 | Discharge: 2018-07-19 | Payer: BC Managed Care – PPO

## 2018-07-23 ENCOUNTER — Encounter: Admit: 2018-07-23 | Discharge: 2018-07-23 | Payer: BC Managed Care – PPO

## 2018-07-23 MED ORDER — MUPIROCIN 2 % TP OINT
Freq: Three times a day (TID) | TOPICAL | 0 refills | 11.00000 days | Status: AC
Start: 2018-07-23 — End: 2019-03-03

## 2018-07-23 MED ORDER — MUPIROCIN 2 % TP OINT
Freq: Three times a day (TID) | TOPICAL | 1 refills | 11.00000 days | Status: AC
Start: 2018-07-23 — End: 2018-08-05

## 2018-08-05 ENCOUNTER — Encounter: Admit: 2018-08-05 | Discharge: 2018-08-05 | Payer: BC Managed Care – PPO

## 2018-08-05 DIAGNOSIS — K219 Gastro-esophageal reflux disease without esophagitis: ICD-10-CM

## 2018-08-05 DIAGNOSIS — C50911 Malignant neoplasm of unspecified site of right female breast: Principal | ICD-10-CM

## 2018-08-05 DIAGNOSIS — Z923 Personal history of irradiation: ICD-10-CM

## 2018-08-05 DIAGNOSIS — K2210 Ulcer of esophagus without bleeding: ICD-10-CM

## 2018-08-05 DIAGNOSIS — R51 Headache: ICD-10-CM

## 2018-08-05 DIAGNOSIS — G478 Other sleep disorders: ICD-10-CM

## 2018-08-05 DIAGNOSIS — K210 Gastro-esophageal reflux disease with esophagitis: ICD-10-CM

## 2018-08-05 DIAGNOSIS — Z1231 Encounter for screening mammogram for malignant neoplasm of breast: Principal | ICD-10-CM

## 2018-08-05 DIAGNOSIS — E162 Hypoglycemia, unspecified: ICD-10-CM

## 2018-08-05 MED ORDER — TAMOXIFEN 20 MG PO TAB
20 mg | ORAL_TABLET | Freq: Every day | ORAL | 3 refills | Status: AC
Start: 2018-08-05 — End: 2019-09-14

## 2018-08-12 ENCOUNTER — Encounter: Admit: 2018-08-12 | Discharge: 2018-08-12 | Payer: BC Managed Care – PPO

## 2018-08-12 ENCOUNTER — Ambulatory Visit: Admit: 2018-08-12 | Discharge: 2018-08-13 | Payer: BC Managed Care – PPO

## 2018-08-12 DIAGNOSIS — R51 Headache: ICD-10-CM

## 2018-08-12 DIAGNOSIS — J3489 Other specified disorders of nose and nasal sinuses: ICD-10-CM

## 2018-08-12 DIAGNOSIS — K219 Gastro-esophageal reflux disease without esophagitis: ICD-10-CM

## 2018-08-12 DIAGNOSIS — C50911 Malignant neoplasm of unspecified site of right female breast: Principal | ICD-10-CM

## 2018-08-12 DIAGNOSIS — Z923 Personal history of irradiation: ICD-10-CM

## 2018-08-12 DIAGNOSIS — K210 Gastro-esophageal reflux disease with esophagitis: ICD-10-CM

## 2018-08-12 DIAGNOSIS — E162 Hypoglycemia, unspecified: ICD-10-CM

## 2018-08-12 DIAGNOSIS — G478 Other sleep disorders: ICD-10-CM

## 2018-08-12 DIAGNOSIS — J3081 Allergic rhinitis due to animal (cat) (dog) hair and dander: ICD-10-CM

## 2018-08-12 DIAGNOSIS — J301 Allergic rhinitis due to pollen: ICD-10-CM

## 2018-08-12 DIAGNOSIS — J3089 Other allergic rhinitis: ICD-10-CM

## 2018-08-12 DIAGNOSIS — K2210 Ulcer of esophagus without bleeding: ICD-10-CM

## 2018-08-12 MED ORDER — AZELASTINE 137 MCG (0.1 %) NA SPRA
2 | Freq: Two times a day (BID) | NASAL | 6 refills | 50.00000 days | Status: AC
Start: 2018-08-12 — End: ?

## 2018-08-12 MED ORDER — IPRATROPIUM BROMIDE 42 MCG (0.06 %) NA SPRY
2 | Freq: Three times a day (TID) | NASAL | 12 refills | 30.00000 days | Status: AC
Start: 2018-08-12 — End: ?

## 2018-10-05 ENCOUNTER — Encounter: Admit: 2018-10-05 | Discharge: 2018-10-05 | Payer: BC Managed Care – PPO

## 2018-10-13 ENCOUNTER — Encounter: Admit: 2018-10-13 | Discharge: 2018-10-13 | Payer: BC Managed Care – PPO

## 2018-10-13 DIAGNOSIS — J309 Allergic rhinitis, unspecified: Principal | ICD-10-CM

## 2018-10-23 ENCOUNTER — Encounter: Admit: 2018-10-23 | Discharge: 2018-10-23 | Payer: BC Managed Care – PPO

## 2018-11-04 ENCOUNTER — Encounter: Admit: 2018-11-04 | Discharge: 2018-11-04 | Payer: BC Managed Care – PPO

## 2018-11-04 ENCOUNTER — Ambulatory Visit: Admit: 2018-11-04 | Discharge: 2018-11-05 | Payer: BC Managed Care – PPO

## 2018-11-04 DIAGNOSIS — R51 Headache: Secondary | ICD-10-CM

## 2018-11-04 DIAGNOSIS — K2210 Ulcer of esophagus without bleeding: Secondary | ICD-10-CM

## 2018-11-04 DIAGNOSIS — G478 Other sleep disorders: Secondary | ICD-10-CM

## 2018-11-04 DIAGNOSIS — C50911 Malignant neoplasm of unspecified site of right female breast: Secondary | ICD-10-CM

## 2018-11-04 DIAGNOSIS — K210 Gastro-esophageal reflux disease with esophagitis: Secondary | ICD-10-CM

## 2018-11-04 DIAGNOSIS — J310 Chronic rhinitis: Secondary | ICD-10-CM

## 2018-11-04 DIAGNOSIS — Z923 Personal history of irradiation: Secondary | ICD-10-CM

## 2018-11-04 DIAGNOSIS — E162 Hypoglycemia, unspecified: Secondary | ICD-10-CM

## 2018-11-04 DIAGNOSIS — K219 Gastro-esophageal reflux disease without esophagitis: Secondary | ICD-10-CM

## 2018-12-11 ENCOUNTER — Encounter: Admit: 2018-12-11 | Discharge: 2018-12-11 | Payer: BC Managed Care – PPO

## 2018-12-11 ENCOUNTER — Ambulatory Visit: Admit: 2018-12-11 | Discharge: 2018-12-12 | Payer: BC Managed Care – PPO

## 2018-12-11 DIAGNOSIS — K210 Gastro-esophageal reflux disease with esophagitis: ICD-10-CM

## 2018-12-11 DIAGNOSIS — G478 Other sleep disorders: ICD-10-CM

## 2018-12-11 DIAGNOSIS — K2210 Ulcer of esophagus without bleeding: ICD-10-CM

## 2018-12-11 DIAGNOSIS — Z923 Personal history of irradiation: ICD-10-CM

## 2018-12-11 DIAGNOSIS — K219 Gastro-esophageal reflux disease without esophagitis: ICD-10-CM

## 2018-12-11 DIAGNOSIS — E162 Hypoglycemia, unspecified: ICD-10-CM

## 2018-12-11 DIAGNOSIS — R51 Headache: ICD-10-CM

## 2018-12-11 DIAGNOSIS — C50911 Malignant neoplasm of unspecified site of right female breast: Principal | ICD-10-CM

## 2018-12-11 NOTE — Progress Notes
???   Hypertension Neg Hx        Allergies:  Allergies   Allergen Reactions   ??? Decongestant [Pseudoephedrine Hcl] SEE COMMENTS     Tachycardia   ??? Feathers EYE IRRITATION   ??? Mold ITCHING   ??? Naproxen SEE COMMENTS     inner ear crystal formation        Review of Systems   Constitutional: Positive for fatigue. Negative for fever and unexpected weight change.   HENT: Negative for voice change.    Respiratory: Negative for cough and shortness of breath.    Cardiovascular: Negative for chest pain and leg swelling.   Gastrointestinal: Positive for constipation. Negative for abdominal pain, blood in stool, diarrhea, nausea and vomiting.   Genitourinary: Negative for difficulty urinating, dyspareunia, dysuria, enuresis, frequency, genital sores, hematuria, menstrual problem, pelvic pain, urgency, vaginal bleeding, vaginal discharge and vaginal pain.   Musculoskeletal: Positive for back pain. Negative for arthralgias.   Skin: Negative for rash.   Neurological: Negative for light-headedness and headaches.   Hematological: Negative for adenopathy. Does not bruise/bleed easily.   Psychiatric/Behavioral: Negative for confusion. The patient is nervous/anxious.          Objective:         ??? azelastine(+) (ASTELIN) 137 mcg (0.1 %) nasal spray Apply two sprays to each nostril as directed twice daily. Use in each nostril as directed   ??? butalbital/acetaminophen/caffeine(+) (FIORICET) 50/325/40 mg tablet Take 1 Tab by mouth every 4 hours as needed for Headache.   ??? calcium citrate (CALCITRATE) 950 mg tab Take 950 mg by mouth daily.   ??? cetirizine (ZYRTEC) 10 mg tablet Take 10 mg by mouth at bedtime daily.   ??? EPINEPHrine (EPIPEN 2-PAK) 1 mg/mL injection pen (2-Pack) Inject 0.3 mg (1 Pen) into thigh if needed for anaphylactic reaction. May repeat in 5-15 minutes if needed.   ??? erythromycin (E-MYCIN) 250 mg tablet Take 250 mg by mouth three times daily.   ??? esomeprazole DR(+) (NEXIUM) 40 mg capsule Take 20 mg by mouth daily.

## 2018-12-12 DIAGNOSIS — N393 Stress incontinence (female) (male): Principal | ICD-10-CM

## 2018-12-16 ENCOUNTER — Encounter: Admit: 2018-12-16 | Discharge: 2018-12-16 | Payer: BC Managed Care – PPO

## 2018-12-18 ENCOUNTER — Encounter: Admit: 2018-12-18 | Discharge: 2018-12-18 | Payer: BC Managed Care – PPO

## 2018-12-18 DIAGNOSIS — N393 Stress incontinence (female) (male): Principal | ICD-10-CM

## 2019-01-06 ENCOUNTER — Encounter: Admit: 2019-01-06 | Discharge: 2019-01-06 | Payer: BC Managed Care – PPO

## 2019-01-23 ENCOUNTER — Encounter: Admit: 2019-01-23 | Discharge: 2019-01-23 | Payer: BC Managed Care – PPO

## 2019-02-02 ENCOUNTER — Encounter: Admit: 2019-02-02 | Discharge: 2019-02-02 | Payer: BC Managed Care – PPO

## 2019-02-02 DIAGNOSIS — R922 Inconclusive mammogram: Principal | ICD-10-CM

## 2019-02-05 ENCOUNTER — Encounter: Admit: 2019-02-05 | Discharge: 2019-02-05 | Payer: BC Managed Care – PPO

## 2019-02-05 DIAGNOSIS — Z1231 Encounter for screening mammogram for malignant neoplasm of breast: Principal | ICD-10-CM

## 2019-02-05 DIAGNOSIS — E162 Hypoglycemia, unspecified: ICD-10-CM

## 2019-02-05 DIAGNOSIS — Z923 Personal history of irradiation: ICD-10-CM

## 2019-02-05 DIAGNOSIS — K210 Gastro-esophageal reflux disease with esophagitis: ICD-10-CM

## 2019-02-05 DIAGNOSIS — C50911 Malignant neoplasm of unspecified site of right female breast: Principal | ICD-10-CM

## 2019-02-05 DIAGNOSIS — K2210 Ulcer of esophagus without bleeding: ICD-10-CM

## 2019-02-05 DIAGNOSIS — R51 Headache: ICD-10-CM

## 2019-02-05 DIAGNOSIS — K219 Gastro-esophageal reflux disease without esophagitis: ICD-10-CM

## 2019-02-05 DIAGNOSIS — R922 Inconclusive mammogram: ICD-10-CM

## 2019-02-05 DIAGNOSIS — G478 Other sleep disorders: ICD-10-CM

## 2019-02-05 NOTE — Progress Notes
measuring approximately 2 cm on mammography. Scattered benign calcifications were present within the right breast.    Ultrasound:    -- Right breast ultrasound 06/02/15 ([place]) revealed at 10:00 6 cm from the nipple demonstrated hypoechoic irregular nonparallel spiculated mass measuring 2.2 cm. On one of the antiradial images this may extend to measure 3 cm.     MRI:    -- Bilateral breast MRI 06/02/15 ([place]) revealed extremely dense breast tissue.  RIGHT BREAST: There was a 1.5 cm AP x 1.9 cm transverse x 1.6 CC cm round, heterogeneously enhancing mass at 10:00, posterior depth with predominantly washout enhancement kinetics, which corresponded to the mass of concern on imaging performed yesterday. There was no abnormal enhancement of the pectoralis muscle. 0.5 cm anteroinferior to the suspicious mass, there was a 0.9 x 0.7 cm irregular enhancing mass with predominantly persistent enhancement kinetics. The total inclusive distance of these 2 discontiguous masses was 2.7 cm in AP dimension. No suspicious right axillary or internal mammary lymph nodes were seen. LEFT BREAST: There was no evidence of abnormal enhancement, mass, or axillary/internal mammary adenopathy.    REPRODUCTIVE HEALTH:  Age at first Menarche:  2  Age at First Live Birth:  N/A  Age at Menopause: surgical menopause at 70; s/p hysterectomy in 2009 and BSO in 2017  Gravida:  0  Para: 0  Breastfeeding:  N/A    PROCEDURES: Right RSL lumpectomy/SLNB, 07/08/15  PERTINENT PMH:  None  FAMILY HISTORY:  Mother diagnosed with breast cancer at age 76; Maternal aunt diagnosed with breast cancer in her 49's; Maternal aunt diagnosed with breast cancer in her 31's  PHYSICAL EXAM on PRESENTATION: Biopsy site changes and hematoma at 10:00 right breast.  Dense breast tissue with benign nodularity noted at 6:00 in the left breast.  No supraclavicular or axillary adenopathy.    MEDICAL ONCOLOGY:  Dr. Welton Flakes  PRESENT THERAPY: Tamoxifen started 07/2015; ??? LAPAROSCOPIC BILATERAL SALPINGO-OOPHORECTOMY Bilateral 04/17/2016    Performed by Pernell Dupre, MD at [facility] OR     Family History   Problem Relation Age of Onset   ??? Cancer Mother 35        Breast cancer   ??? Diabetes Mother    ??? Cancer-Breast Mother    ??? Cancer Other 25        M-aunt breast cancer   ??? Cancer-Breast Other    ??? Cancer Other 59        M-aunt breast cancer   ??? Cancer-Breast Other    ??? Heart Attack Neg Hx    ??? Stroke Neg Hx    ??? Cervical Cancer Neg Hx    ??? Cancer-Colon Neg Hx    ??? Cancer-Ovarian Neg Hx    ??? Heart Disease Neg Hx    ??? Hypertension Neg Hx      Social History     Socioeconomic History   ??? Marital status: Married     Spouse name: Not on file   ??? Number of children: Not on file   ??? Years of education: Not on file   ??? Highest education level: Not on file   Occupational History   ??? Not on file   Tobacco Use   ??? Smoking status: Never Smoker   ??? Smokeless tobacco: Never Used   ??? Tobacco comment: 5.11.17   Substance and Sexual Activity   ??? Alcohol use: No     Alcohol/week: 0.0 standard drinks   ??? Drug use: No   ??? Sexual activity:  Yes     Partners: Male     Birth control/protection: Surgical, None   Other Topics Concern   ??? Not on file   Social History Narrative   ??? Not on file         Objective:         ??? azelastine(+) (ASTELIN) 137 mcg (0.1 %) nasal spray Apply two sprays to each nostril as directed twice daily. Use in each nostril as directed   ??? butalbital/acetaminophen/caffeine(+) (FIORICET) 50/325/40 mg tablet Take 1 Tab by mouth every 4 hours as needed for Headache.   ??? calcium citrate (CALCITRATE) 950 mg tab Take 950 mg by mouth daily.   ??? cetirizine (ZYRTEC) 10 mg tablet Take 10 mg by mouth at bedtime daily.   ??? EPINEPHrine (EPIPEN 2-PAK) 1 mg/mL injection pen (2-Pack) Inject 0.3 mg (1 Pen) into thigh if needed for anaphylactic reaction. May repeat in 5-15 minutes if needed.   ??? erythromycin (E-MYCIN) 250 mg tablet Take 250 mg by mouth three times daily. ??? esomeprazole DR(+) (NEXIUM) 40 mg capsule Take 20 mg by mouth daily. Take on an empty stomach at least 1 hour before or 2 hours after food.    ??? fluticasone (FLONASE) 50 mcg/actuation nasal spray Apply  to each nostril as directed daily. Shake bottle gently before using.   ??? gabapentin (NEURONTIN) 300 mg capsule TAKE 1 CAPSULE BY MOUTH ONCE DAILY AT BEDTIME TAKE 30 MINUTES PRIOR TO BEDTIME   ??? Glucosamine-Chondroit-Vit C-Mn (GLUCOSAMINE CHONDROITIN MAXSTR) 500-400 mg cap Take 1 Cap by mouth daily.   ??? ipratropium bromide (ATROVENT) 42 mcg (0.06 %) nasal spray Apply two sprays to each nostril as directed three times daily.   ??? Lactobacillus rhamnosus GG (LACTOBACILLUS RHAMNOSUS (GG)) 15 billion cell cpSP Take 1 Cap by mouth as directed daily with breakfast.   ??? meloxicam (MOBIC) 7.5 mg tablet Take 7.5 mg by mouth daily.   ??? mupirocin (BACTROBAN) 2 % topical ointment Apply  topically to affected area three times daily.   ??? promethazine (PHENERGAN) 12.5 mg tablet Take 12.5 mg by mouth every 6 hours as needed for Nausea.   ??? tamoxifen (NOLVADEX) 20 mg tablet Take one tablet by mouth daily.   ??? vitamin E 400 unit capsule Take 400 Units by mouth daily.   ??? vitamins, multiple tablet Take 1 Tab by mouth daily.     Vitals:    02/05/19 1055   Temp: 37 ???C (98.6 ???F)   TempSrc: Temporal   Weight: 56.7 kg (125 lb)   Height: 165.1 cm (65)   PainSc: Zero     Body mass index is 20.8 kg/m???.     Physical Exam  Vitals signs reviewed.     Constitutional: No acute distress.  HEENT:  Head: Normocephalic and atraumatic.  Eyes: No discharge. No scleral icterus.  Pulmonary/Chest: No respiratory distress.   Neurological: Alert and oriented to person, place and time. No cranial nerve deficit.  Skin: Warm and dry. No rash noted. No erythema. No pallor. mild redness of lateral left thumb  Psychiatric: Normal mood and affect. Behavior is normal. Judgement and thought content normal.             Assessment and Plan:

## 2019-02-06 ENCOUNTER — Encounter: Admit: 2019-02-06 | Discharge: 2019-02-06 | Payer: BC Managed Care – PPO

## 2019-03-03 ENCOUNTER — Encounter: Admit: 2019-03-03 | Discharge: 2019-03-03 | Payer: BC Managed Care – PPO

## 2019-03-03 MED ORDER — MUPIROCIN 2 % TP OINT
Freq: Three times a day (TID) | TOPICAL | 0 refills | 11.00000 days | Status: AC
Start: 2019-03-03 — End: ?

## 2019-03-12 ENCOUNTER — Encounter: Admit: 2019-03-12 | Discharge: 2019-03-12 | Payer: BC Managed Care – PPO

## 2019-04-17 ENCOUNTER — Encounter: Admit: 2019-04-17 | Discharge: 2019-04-17

## 2019-04-17 ENCOUNTER — Ambulatory Visit: Admit: 2019-04-17 | Discharge: 2019-04-18

## 2019-04-17 DIAGNOSIS — R51 Headache: Secondary | ICD-10-CM

## 2019-04-17 DIAGNOSIS — J3089 Other allergic rhinitis: Secondary | ICD-10-CM

## 2019-04-17 DIAGNOSIS — E162 Hypoglycemia, unspecified: Secondary | ICD-10-CM

## 2019-04-17 DIAGNOSIS — K2210 Ulcer of esophagus without bleeding: Secondary | ICD-10-CM

## 2019-04-17 DIAGNOSIS — K210 Gastro-esophageal reflux disease with esophagitis: Secondary | ICD-10-CM

## 2019-04-17 DIAGNOSIS — C50911 Malignant neoplasm of unspecified site of right female breast: Secondary | ICD-10-CM

## 2019-04-17 DIAGNOSIS — G478 Other sleep disorders: Secondary | ICD-10-CM

## 2019-04-17 DIAGNOSIS — Z923 Personal history of irradiation: Secondary | ICD-10-CM

## 2019-04-17 DIAGNOSIS — J301 Allergic rhinitis due to pollen: Secondary | ICD-10-CM

## 2019-04-17 DIAGNOSIS — K219 Gastro-esophageal reflux disease without esophagitis: Secondary | ICD-10-CM

## 2019-04-17 NOTE — Progress Notes
Telehealth Visit Note    Date of Service: 04/17/2019    Subjective:      Obtained patient's verbal consent to treat them and their agreement to [facility] financial policy and NPP via this telehealth visit during the [facility] The vital signs documented in this encounter are patient reported.        Victoria Mcknight is a 48 y.o. female.    History of Present Illness    Presents for routine allergy and immunotherapy surveillance.  She reports stable control of her allergy symptoms and was tolerating monthly immunotherapy injections without difficulty.  She denies significant nasal congestion, headaches, facial pain/pressure, or post nasal drainage.  She stopped monthly shots during the pandemic and notes some mild eye watering and intermittent rhinorrhea.         Review of Systems   Constitutional: Negative.    HENT: Positive for rhinorrhea and sneezing.    Eyes: Positive for discharge.   Respiratory: Negative.    Cardiovascular: Negative.    Gastrointestinal: Negative.    Endocrine: Negative.    Genitourinary: Negative.    Musculoskeletal: Negative.    Skin: Negative.    Allergic/Immunologic: Negative.    Neurological: Negative.    Hematological: Negative.    Psychiatric/Behavioral: Negative.          Objective:         ??? azelastine(+) (ASTELIN) 137 mcg (0.1 %) nasal spray Apply two sprays to each nostril as directed twice daily. Use in each nostril as directed   ??? butalbital/acetaminophen/caffeine(+) (FIORICET) 50/325/40 mg tablet Take 1 Tab by mouth every 4 hours as needed for Headache.   ??? calcium citrate (CALCITRATE) 950 mg tab Take 950 mg by mouth daily.   ??? cetirizine (ZYRTEC) 10 mg tablet Take 10 mg by mouth at bedtime daily.   ??? EPINEPHrine (EPIPEN 2-PAK) 1 mg/mL injection pen (2-Pack) Inject 0.3 mg (1 Pen) into thigh if needed for anaphylactic reaction. May repeat in 5-15 minutes if needed.   ??? erythromycin (E-MYCIN) 250 mg tablet Take 250 mg by mouth three times daily. ??? esomeprazole DR(+) (NEXIUM) 40 mg capsule Take 20 mg by mouth daily. Take on an empty stomach at least 1 hour before or 2 hours after food.    ??? fluticasone (FLONASE) 50 mcg/actuation nasal spray Apply  to each nostril as directed daily. Shake bottle gently before using.   ??? gabapentin (NEURONTIN) 300 mg capsule TAKE 1 CAPSULE BY MOUTH ONCE DAILY AT BEDTIME TAKE 30 MINUTES PRIOR TO BEDTIME   ??? Glucosamine-Chondroit-Vit C-Mn (GLUCOSAMINE CHONDROITIN MAXSTR) 500-400 mg cap Take 1 Cap by mouth daily.   ??? ipratropium bromide (ATROVENT) 42 mcg (0.06 %) nasal spray Apply two sprays to each nostril as directed three times daily.   ??? Lactobacillus rhamnosus GG (LACTOBACILLUS RHAMNOSUS (GG)) 15 billion cell cpSP Take 1 Cap by mouth as directed daily with breakfast.   ??? meloxicam (MOBIC) 7.5 mg tablet Take 7.5 mg by mouth daily.   ??? mupirocin (BACTROBAN) 2 % topical ointment Apply  topically to affected area three times daily.   ??? promethazine (PHENERGAN) 12.5 mg tablet Take 12.5 mg by mouth every 6 hours as needed for Nausea.   ??? tamoxifen (NOLVADEX) 20 mg tablet Take one tablet by mouth daily.   ??? vitamin E 400 unit capsule Take 400 Units by mouth daily.   ??? vitamins, multiple tablet Take 1 Tab by mouth daily.     Vitals:    04/17/19 1010  Weight: 56.7 kg (125 lb)   Height: 162.6 cm (64)   PainSc: Two     Body mass index is 21.46 kg/m???.     Physical Exam  Constitutional:       General: She is not in acute distress.     Appearance: Normal appearance. She is well-developed and well-groomed. She is obese. She is not ill-appearing or toxic-appearing.   HENT:      Head: Normocephalic and atraumatic.      Right Ear: Hearing and external ear normal.      Left Ear: Hearing and external ear normal.      Nose: Nose normal. No nasal deformity.   Eyes:      General: Lids are normal. Gaze aligned appropriately. No allergic shiner.     Extraocular Movements: Extraocular movements intact. Conjunctiva/sclera: Conjunctivae normal.   Neck:      Musculoskeletal: Normal range of motion.   Pulmonary:      Effort: Pulmonary effort is normal.   Neurological:      Mental Status: She is alert and oriented to person, place, and time.      Cranial Nerves: Cranial nerves are intact.      Motor: Motor function is intact.   Psychiatric:         Attention and Perception: Attention and perception normal.         Mood and Affect: Mood normal.         Speech: Speech normal.         Behavior: Behavior normal.              Assessment and Plan:    Victoria Mcknight is doing well with her current regimen of immunotherapy. She has effectively completed treatment and we will now plan to stop her shots. She will follow up in 6 months for repeat evaluation, sooner if she has any problems.  We discussed trying OTC pataday PRN for eye watering vs adding moisturizing eye drops to her regimen for possible dry eyes.  We also discussed restarting PRN use of astelin.                                 15 minutes spent on this patient's encounter.

## 2019-04-29 ENCOUNTER — Encounter: Admit: 2019-04-29 | Discharge: 2019-04-29

## 2019-04-30 ENCOUNTER — Encounter: Admit: 2019-04-30 | Discharge: 2019-04-30

## 2019-04-30 MED ORDER — MONTELUKAST 10 MG PO TAB
10 mg | ORAL_TABLET | Freq: Every evening | ORAL | 0 refills | 90.00000 days | Status: DC
Start: 2019-04-30 — End: 2019-06-02

## 2019-05-15 ENCOUNTER — Encounter: Admit: 2019-05-15 | Discharge: 2019-05-15

## 2019-05-26 ENCOUNTER — Encounter: Admit: 2019-05-26 | Discharge: 2019-05-26

## 2019-06-02 ENCOUNTER — Encounter: Admit: 2019-06-02 | Discharge: 2019-06-02

## 2019-06-02 ENCOUNTER — Encounter: Admit: 2019-06-02 | Discharge: 2019-06-03

## 2019-06-02 DIAGNOSIS — K2210 Ulcer of esophagus without bleeding: Secondary | ICD-10-CM

## 2019-06-02 DIAGNOSIS — C50911 Malignant neoplasm of unspecified site of right female breast: Secondary | ICD-10-CM

## 2019-06-02 DIAGNOSIS — Z1231 Encounter for screening mammogram for malignant neoplasm of breast: Secondary | ICD-10-CM

## 2019-06-02 DIAGNOSIS — R51 Headache: Secondary | ICD-10-CM

## 2019-06-02 DIAGNOSIS — K219 Gastro-esophageal reflux disease without esophagitis: Secondary | ICD-10-CM

## 2019-06-02 DIAGNOSIS — E162 Hypoglycemia, unspecified: Secondary | ICD-10-CM

## 2019-06-02 DIAGNOSIS — Z923 Personal history of irradiation: Secondary | ICD-10-CM

## 2019-06-02 DIAGNOSIS — Z9189 Other specified personal risk factors, not elsewhere classified: Secondary | ICD-10-CM

## 2019-06-02 DIAGNOSIS — R922 Inconclusive mammogram: Secondary | ICD-10-CM

## 2019-06-02 DIAGNOSIS — K210 Gastro-esophageal reflux disease with esophagitis: Secondary | ICD-10-CM

## 2019-06-02 DIAGNOSIS — G478 Other sleep disorders: Secondary | ICD-10-CM

## 2019-06-02 NOTE — Progress Notes
Bioimpedance Spectroscopy performed.  Advised patient that Normal result will be sent within 24 hours by mail or via Mychart (preferred).  The patient will be contacted via phone by the lymphedema nurse with any abnormal results.

## 2019-06-02 NOTE — Progress Notes
Reviewed BIS testing results from today  Results  L-Dex:2.5  Baseline-2.9  Change from Baseline:5.4     WNL less than 3 standard deviation increase from baseline.      Notified patient viaMyChart result was normal and to continue with routine follow up as scheduled.  Provided clinic contact information for any questions or concerns.

## 2019-06-02 NOTE — Progress Notes
Date of Service: 06/02/2019      Subjective:             Reason for Visit:  Cancer      Victoria Mcknight is a 48 y.o. female.    Cancer Staging  Malignant neoplasm of upper-outer quadrant of right breast in female, estrogen receptor positive (HCC)  Staging form: Breast, AJCC 7th Edition  - Clinical: Stage IA (T1c, N0, cM0) - Signed by Judye Bos, PA-C on 06/15/2015  - Pathologic stage from 07/19/2015: Stage IIA (T2, N0, cM0) - Signed by Dimas Alexandria, PA-C on 07/19/2015      History of Present Illness    DIAGNOSIS: Right breast cancer     PAST ONCOLOGY HISTORY: Victoria Mcknight is a 48 year old female who underwent a routine bilateral screening mammogram on 05/24/15 [facility]) that revealed mass in upper outer quadrant of the right breast. On 06/02/15 she underwent a right mammogram ([place]) that revealed 2cm mass. Right ultrasound ([place])revealed at 10:00 6 cm from the   nipple demonstrates hypoechoic irregular nonparallel spiculated mass measuring 2.2 cm. On one of the antiradial images this may extend to measure 3 cm. This can be further characterized on MRI. Biopsy of this right breast mass will be needed.  MRI breast on 06/02/15 ([place]) revealed in the right breast a 1.5 cm AP x 1.9 cm transverse x 1.6 CC cm round, heterogeneously enhancing mass at 10:00, posterior depth with predominantly washout enhancement kinetics, which corresponds to the mass of concern. There is no abnormal enhancement of the pectoralis muscle. 0.5 cm anteroinferior to the suspicious mass, there is a 0.9 x 0.7 cm irregular enhancing mass with predominantly persistent enhancement kinetics. The total inclusive distance of these 2 discontiguous masses is 2.7 cm in AP dimension. No suspicious right axillary or internal mammary lymph nodes are seen. In the left breast there is no evidence of abnormal enhancement, mass, or axillary/internal mammary adenopathy. On 06/07/15 ([place]) she underwent a right breast biopsy that revealed IDC, grade II, ER 99%, PR 100%, HER2 1+ IHC, FISH 1.1, Ki67 18%. On 07/08/15 she underwent a right lumpectomy and SLNB. Pathology revealed IDC, grade II, measuring 3.1cm, 0 LN involved (0/3). Oncotype DX score 11. Her 10-year risk of distant recurrence with Tamoxifen is estimated at 7%.     OB/GYN HISTORY: Age at onset of menstruation 12-12. G0P0. She took birth control from age [age]. LMP 2009 with hysterectomy. Bilateral ovaries intact. She never took HRT.     PRESENT THERAPY: Tamoxifen 20mg  daily 07/2015    Interval History:   Victoria Mcknight presents for routine follow up. She stopped Tamoxifen 2 weeks ago for increased joint pain. She was not sure if the joint pain was from added stress or the medication. Her joint pain has improved since stopping the Tamoxifen.      Review of Systems   Constitutional: Negative for appetite change, fatigue, fever and unexpected weight change.        No hot flashes    HENT: Negative for congestion, mouth sores and sore throat.    Eyes: Positive for itching. Negative for visual disturbance.   Respiratory: Negative.  Negative for cough, shortness of breath and wheezing.    Cardiovascular: Negative.  Negative for chest pain and palpitations.   Gastrointestinal: Negative.  Negative for abdominal distention, abdominal pain, anal bleeding, constipation, diarrhea, nausea and vomiting.   Genitourinary: Negative for hematuria, vaginal bleeding and vaginal discharge.        Post Menopausal  LMP 2009 S/P hysterectomy   BSO 04/17/16   Musculoskeletal: Positive for arthralgias (bilateral hands and knees). Negative for back pain and myalgias.   Skin: Negative for rash.   Allergic/Immunologic: Positive for environmental allergies.   Neurological: Negative.  Negative for dizziness, numbness and headaches.   Hematological: Negative for adenopathy.   Psychiatric/Behavioral: Negative.    All other systems reviewed and are negative.        Allergies   Allergen Reactions ??? Decongestant [Pseudoephedrine Hcl] SEE COMMENTS     Tachycardia   ??? Feathers EYE IRRITATION   ??? Doxycycline STOMACH UPSET   ??? Mold ITCHING   ??? Naproxen SEE COMMENTS     inner ear crystal formation       Objective:         ??? azelastine(+) (ASTELIN) 137 mcg (0.1 %) nasal spray Apply two sprays to each nostril as directed twice daily. Use in each nostril as directed   ??? butalbital/acetaminophen/caffeine(+) (FIORICET) 50/325/40 mg tablet Take 1 Tab by mouth every 4 hours as needed for Headache.   ??? calcium citrate (CALCITRATE) 950 mg tab Take 950 mg by mouth daily.   ??? cetirizine (ZYRTEC) 10 mg tablet Take 10 mg by mouth at bedtime daily.   ??? EPINEPHrine (EPIPEN 2-PAK) 1 mg/mL injection pen (2-Pack) Inject 0.3 mg (1 Pen) into thigh if needed for anaphylactic reaction. May repeat in 5-15 minutes if needed.   ??? erythromycin (E-MYCIN) 250 mg tablet Take 250 mg by mouth three times daily.   ??? esomeprazole DR(+) (NEXIUM) 40 mg capsule Take 20 mg by mouth daily. Take on an empty stomach at least 1 hour before or 2 hours after food.    ??? fluticasone (FLONASE) 50 mcg/actuation nasal spray Apply  to each nostril as directed daily. Shake bottle gently before using.   ??? gabapentin (NEURONTIN) 300 mg capsule TAKE 1 CAPSULE BY MOUTH ONCE DAILY AT BEDTIME TAKE 30 MINUTES PRIOR TO BEDTIME   ??? Glucosamine-Chondroit-Vit C-Mn (GLUCOSAMINE CHONDROITIN MAXSTR) 500-400 mg cap Take 1 Cap by mouth daily.   ??? ipratropium bromide (ATROVENT) 42 mcg (0.06 %) nasal spray Apply two sprays to each nostril as directed three times daily.   ??? Lactobacillus rhamnosus GG (LACTOBACILLUS RHAMNOSUS (GG)) 15 billion cell cpSP Take 1 Cap by mouth as directed daily with breakfast.   ??? meloxicam (MOBIC) 7.5 mg tablet Take 7.5 mg by mouth daily.   ??? montelukast (SINGULAIR) 10 mg tablet Take one tablet by mouth at bedtime daily.   ??? mupirocin (BACTROBAN) 2 % topical ointment Apply  topically to affected area three times daily. ??? promethazine (PHENERGAN) 12.5 mg tablet Take 12.5 mg by mouth every 6 hours as needed for Nausea.   ??? tamoxifen (NOLVADEX) 20 mg tablet Take one tablet by mouth daily.   ??? vitamin E 400 unit capsule Take 400 Units by mouth daily.   ??? vitamins, multiple tablet Take 1 Tab by mouth daily.       Body mass index is 21.05 kg/m???.     Pain Score: Zero         Pain Addressed:  N/A    Patient Evaluated for a Clinical Trial: No treatment clinical trial available for this patient.     [place] Cooperative Oncology Group performance status is 0, Fully active, able to carry on all pre-disease performance without restriction.[name]     Physical Exam  Vitals signs and nursing note reviewed.   Constitutional:       Appearance: She is  well-developed.   HENT:      Head: Normocephalic.      Mouth/Throat:      Pharynx: No oropharyngeal exudate.   Eyes:      Conjunctiva/sclera: Conjunctivae normal.      Pupils: Pupils are equal, round, and reactive to light.   Neck:      Musculoskeletal: Normal range of motion and neck supple.   Cardiovascular:      Rate and Rhythm: Normal rate and regular rhythm.      Heart sounds: Normal heart sounds. No murmur.   Pulmonary:      Effort: Pulmonary effort is normal. No respiratory distress.      Breath sounds: Normal breath sounds. No wheezing or rales.   Chest:      Chest wall: No tenderness.      Breasts:         Right: No inverted nipple, mass, nipple discharge, skin change or tenderness.         Left: No inverted nipple, mass, nipple discharge, skin change or tenderness.       Abdominal:      General: Bowel sounds are normal. There is no distension.      Palpations: Abdomen is soft. There is no mass.      Tenderness: There is no abdominal tenderness. There is no guarding or rebound.   Musculoskeletal: Normal range of motion.         General: No tenderness.   Lymphadenopathy:      Cervical: No cervical adenopathy.      Upper Body:      Right upper body: No supraclavicular adenopathy. Left upper body: No supraclavicular adenopathy.   Skin:     General: Skin is warm and dry.      Findings: No rash.   Neurological:      Mental Status: She is alert and oriented to person, place, and time.   Psychiatric:         Behavior: Behavior normal.       RADIOLOGY:  Bilateral mammogram 01/17/16: BIRAD 2-benign  Bilateral mammogram 01/17/17: BIRAD 2-benign  Bilateral mammogram 01/16/18: BIRAD 1-negative  Bilateral ABUS 01/16/18: BIRAD 1-negative  Bilateral mammogram 06/02/2019: Terance Hart 2-benign  Bilateral ABUS 06/02/2019: BIRAD 2-benign          Assessment and Plan:  1. Victoria Graciano is a 48 year old female with right breast IDC, grade 2, ER/PR + and HER2 negative.  2. We have reviewed the patient's radiology and pathology results in detail with her and her husband.  We have reviewed the particular features of her cancer including stage, grade, hormone receptor and HER2 neu status.   3. Due to family history; genetic testing. Negative.  4. Oncotype DX score 11. Her 10-year risk of distant recurrence with Tamoxifen is estimated at 7%.  We are not recommending chemotherapy at this time. We are recommending 5-10 years of endocrine therapy. She was started on Tamoxifen 20mg  daily, 07/2015. Estradiol was elevated so we recommended Zoladex every 3 months for 5 years. BSO 03/2016; so we stopped Zoladex.  She stopped Tamoxifen 2 weeks ago for increased joint pain. She was not sure if the joint pain was from added stress or the medication. Her joint pain has improved since stopping the Tamoxifen. She will restart the Tamoxifen now.   5. Completed radiation in [place] with Dr Darcus Austin 09/29/15.   6. Bilateral mammogram due 05/2020.   7. RTC in 6 months.     My collaborating  MD Dr Raul Del.     For up to date information on the COVID-19 virus, visit the [facility] website. [email or web address]  ? General supportive care during cold and flu season and infection prevention reminders: o Wash hands often with soap and water for at least 20 seconds  o Cover your mouth and nose  o Social distancing: try to maintain 6 feet between you and other people  o Stay home if sick and symptoms mild or manageable  ? If you must be around people wear a mask    ? If you are having symptoms of a lower respiratory infection (cough, shortness of breath) and/or fever AND either traveled in last 30 days (internationally or to region of exposure) OR known exposure to patient with COVID19:    o Call your primary care provider for questions or health needs.   ? Tell your doctor about your recent travel and your symptoms    o In a medical emergency, call 911 or go to the nearest emergency room.  Total time 30 minutes.  Estimated counseling time 25 minutes.  Counseled Miaya regarding breast cancer, Tamoxifen side effects and COVID19.      Elenore Paddy Ezequiel Macauley, APRN-NP

## 2019-06-09 ENCOUNTER — Ambulatory Visit: Admit: 2019-06-09 | Discharge: 2019-06-09 | Payer: BC Managed Care – PPO

## 2019-06-09 DIAGNOSIS — K219 Gastro-esophageal reflux disease without esophagitis: Secondary | ICD-10-CM

## 2019-06-09 DIAGNOSIS — J301 Allergic rhinitis due to pollen: Secondary | ICD-10-CM

## 2019-06-09 DIAGNOSIS — J4520 Mild intermittent asthma, uncomplicated: Secondary | ICD-10-CM

## 2019-06-09 NOTE — Patient Instructions
Today we discussed that your persistent and possibly increasing chest tightness may be related to allergy or hyperacidity.  I recommended that you retry the singulair, but should your reflux worsen again while taking it, we discussed discontinuing the Singulair again.      We also discussed a trial of over the counter Pepcid (famotidine) 40mg  in the evenings for 4-6 weeks should you not tolerate the singulair again.      Call for follow up if neither of these are effective.

## 2019-06-09 NOTE — Progress Notes
Subjective:       History of Present Illness  Victoria Mcknight is a 48 y.o. female.    Chest tightness increasing lately - still intermittent and worse at night.  Seemed to start after starting singulair - so stopped singulair.  Did feel singulair helped breathing and chest tightness.  Seems like smoke is a trigger.  Associated cough.  Knows reflux is a trigger.         Review of Systems   Constitutional: Negative for fatigue and fever.   HENT: Negative for congestion, facial swelling, nosebleeds, postnasal drip, rhinorrhea, sinus pressure, sneezing, sore throat, trouble swallowing and voice change.    Eyes: Negative for pain, redness and itching.   Respiratory: Positive for cough and chest tightness. Negative for wheezing.    Neurological: Negative for headaches.         Objective:         ??? azelastine(+) (ASTELIN) 137 mcg (0.1 %) nasal spray Apply two sprays to each nostril as directed twice daily. Use in each nostril as directed   ??? butalbital/acetaminophen/caffeine(+) (FIORICET) 50/325/40 mg tablet Take 1 Tab by mouth every 4 hours as needed for Headache.   ??? calcium citrate (CALCITRATE) 950 mg tab Take 950 mg by mouth daily.   ??? cetirizine (ZYRTEC) 10 mg tablet Take 10 mg by mouth at bedtime daily.   ??? EPINEPHrine (EPIPEN 2-PAK) 1 mg/mL injection pen (2-Pack) Inject 0.3 mg (1 Pen) into thigh if needed for anaphylactic reaction. May repeat in 5-15 minutes if needed.   ??? esomeprazole DR(+) (NEXIUM) 40 mg capsule Take 20 mg by mouth daily. Take on an empty stomach at least 1 hour before or 2 hours after food.    ??? fluticasone (FLONASE) 50 mcg/actuation nasal spray Apply  to each nostril as directed daily. Shake bottle gently before using.   ??? gabapentin (NEURONTIN) 300 mg capsule TAKE 1 CAPSULE BY MOUTH ONCE DAILY AT BEDTIME TAKE 30 MINUTES PRIOR TO BEDTIME   ??? Glucosamine-Chondroit-Vit C-Mn (GLUCOSAMINE CHONDROITIN MAXSTR) 500-400 mg cap Take 1 Cap by mouth daily. ??? ipratropium bromide (ATROVENT) 42 mcg (0.06 %) nasal spray Apply two sprays to each nostril as directed three times daily.   ??? Lactobacillus rhamnosus GG (LACTOBACILLUS RHAMNOSUS (GG)) 15 billion cell cpSP Take 1 Cap by mouth as directed daily with breakfast.   ??? meloxicam (MOBIC) 7.5 mg tablet Take 7.5 mg by mouth daily.   ??? mupirocin (BACTROBAN) 2 % topical ointment Apply  topically to affected area three times daily.   ??? promethazine (PHENERGAN) 12.5 mg tablet Take 12.5 mg by mouth every 6 hours as needed for Nausea.   ??? tamoxifen (NOLVADEX) 20 mg tablet Take one tablet by mouth daily.   ??? vitamin E 400 unit capsule Take 400 Units by mouth daily.   ??? vitamins, multiple tablet Take 1 Tab by mouth daily.         Physical Exam  Constitutional:       General: She is not in acute distress.     Appearance: Normal appearance. She is well-developed and well-groomed. She is obese. She is not ill-appearing or toxic-appearing.   HENT:      Head: Normocephalic and atraumatic.      Right Ear: Hearing and external ear normal.      Left Ear: Hearing and external ear normal.      Nose: Nose normal. No nasal deformity.   Eyes:      General: Lids are normal. Gaze aligned appropriately.  No allergic shiner.     Extraocular Movements: Extraocular movements intact.      Conjunctiva/sclera: Conjunctivae normal.   Neck:      Musculoskeletal: Normal range of motion.   Pulmonary:      Effort: Pulmonary effort is normal.   Neurological:      Mental Status: She is alert and oriented to person, place, and time.      Cranial Nerves: Cranial nerves are intact.      Motor: Motor function is intact.   Psychiatric:         Attention and Perception: Attention and perception normal.         Mood and Affect: Mood normal.         Speech: Speech normal.         Behavior: Behavior normal.              Assessment and Plan:    Persistent and possibly increasing chest tightness that may be related to allergy or hyperacidity.  I recommended she retry the singulair, but should her reflux worsen again while on it she will discontinue.  We also discussed a trial of Pepcid in the evenings should she not tolerate the singulair again.  She will call for follow up if neither of these are effective.

## 2019-06-12 ENCOUNTER — Encounter: Admit: 2019-06-12 | Discharge: 2019-06-12

## 2019-06-15 ENCOUNTER — Encounter: Admit: 2019-06-15 | Discharge: 2019-06-15

## 2019-06-15 DIAGNOSIS — Z1231 Encounter for screening mammogram for malignant neoplasm of breast: Secondary | ICD-10-CM

## 2019-06-15 DIAGNOSIS — R922 Inconclusive mammogram: Secondary | ICD-10-CM

## 2019-08-26 ENCOUNTER — Encounter: Admit: 2019-08-26 | Discharge: 2019-08-26 | Payer: BC Managed Care – PPO

## 2019-08-27 ENCOUNTER — Encounter: Admit: 2019-08-27 | Discharge: 2019-08-27 | Payer: BC Managed Care – PPO

## 2019-08-27 MED ORDER — MONTELUKAST 10 MG PO TAB
10 mg | ORAL_TABLET | Freq: Every evening | ORAL | 3 refills | 90.00000 days | Status: DC
Start: 2019-08-27 — End: 2019-09-02

## 2019-09-02 ENCOUNTER — Encounter: Admit: 2019-09-02 | Discharge: 2019-09-02 | Payer: BC Managed Care – PPO

## 2019-09-02 MED ORDER — MONTELUKAST 10 MG PO TAB
10 mg | ORAL_TABLET | Freq: Every evening | ORAL | 3 refills | 90.00000 days | Status: AC
Start: 2019-09-02 — End: ?

## 2019-09-12 ENCOUNTER — Encounter: Admit: 2019-09-12 | Discharge: 2019-09-12 | Payer: BC Managed Care – PPO

## 2019-09-14 MED ORDER — TAMOXIFEN 20 MG PO TAB
ORAL_TABLET | Freq: Every day | 0 refills | Status: DC
Start: 2019-09-14 — End: 2019-12-28

## 2019-10-06 ENCOUNTER — Encounter: Admit: 2019-10-06 | Discharge: 2019-10-06 | Payer: BC Managed Care – PPO

## 2019-10-21 ENCOUNTER — Encounter: Admit: 2019-10-21 | Discharge: 2019-10-21 | Payer: BC Managed Care – PPO

## 2019-10-30 ENCOUNTER — Encounter: Admit: 2019-10-30 | Discharge: 2019-10-30 | Payer: BC Managed Care – PPO

## 2019-10-30 ENCOUNTER — Ambulatory Visit: Admit: 2019-10-30 | Discharge: 2019-10-31 | Payer: BC Managed Care – PPO

## 2019-10-30 DIAGNOSIS — R519 Generalized headaches: Secondary | ICD-10-CM

## 2019-10-30 DIAGNOSIS — K21 Bile reflux esophagitis: Secondary | ICD-10-CM

## 2019-10-30 DIAGNOSIS — C50911 Malignant neoplasm of unspecified site of right female breast: Secondary | ICD-10-CM

## 2019-10-30 DIAGNOSIS — K2210 Ulcer of esophagus without bleeding: Secondary | ICD-10-CM

## 2019-10-30 DIAGNOSIS — K219 Gastro-esophageal reflux disease without esophagitis: Secondary | ICD-10-CM

## 2019-10-30 DIAGNOSIS — E162 Hypoglycemia, unspecified: Secondary | ICD-10-CM

## 2019-10-30 DIAGNOSIS — Z923 Personal history of irradiation: Secondary | ICD-10-CM

## 2019-10-30 DIAGNOSIS — G478 Other sleep disorders: Secondary | ICD-10-CM

## 2019-10-31 ENCOUNTER — Encounter: Admit: 2019-10-31 | Discharge: 2019-10-31 | Payer: BC Managed Care – PPO

## 2019-10-31 DIAGNOSIS — K21 Bile reflux esophagitis: Secondary | ICD-10-CM

## 2019-10-31 DIAGNOSIS — E162 Hypoglycemia, unspecified: Secondary | ICD-10-CM

## 2019-10-31 DIAGNOSIS — K2210 Ulcer of esophagus without bleeding: Secondary | ICD-10-CM

## 2019-10-31 DIAGNOSIS — Z923 Personal history of irradiation: Secondary | ICD-10-CM

## 2019-10-31 DIAGNOSIS — K219 Gastro-esophageal reflux disease without esophagitis: Secondary | ICD-10-CM

## 2019-10-31 DIAGNOSIS — G478 Other sleep disorders: Secondary | ICD-10-CM

## 2019-10-31 DIAGNOSIS — R519 Generalized headaches: Secondary | ICD-10-CM

## 2019-10-31 DIAGNOSIS — C50911 Malignant neoplasm of unspecified site of right female breast: Secondary | ICD-10-CM

## 2019-11-19 ENCOUNTER — Encounter: Admit: 2019-11-19 | Discharge: 2019-11-19 | Payer: BC Managed Care – PPO

## 2019-12-01 ENCOUNTER — Encounter: Admit: 2019-12-01 | Discharge: 2019-12-01 | Payer: BC Managed Care – PPO

## 2019-12-01 DIAGNOSIS — R519 Generalized headaches: Secondary | ICD-10-CM

## 2019-12-01 DIAGNOSIS — C50911 Malignant neoplasm of unspecified site of right female breast: Secondary | ICD-10-CM

## 2019-12-01 DIAGNOSIS — K21 Bile reflux esophagitis: Secondary | ICD-10-CM

## 2019-12-01 DIAGNOSIS — G478 Other sleep disorders: Secondary | ICD-10-CM

## 2019-12-01 DIAGNOSIS — K2210 Ulcer of esophagus without bleeding: Secondary | ICD-10-CM

## 2019-12-01 DIAGNOSIS — K219 Gastro-esophageal reflux disease without esophagitis: Secondary | ICD-10-CM

## 2019-12-01 DIAGNOSIS — Z923 Personal history of irradiation: Secondary | ICD-10-CM

## 2019-12-01 DIAGNOSIS — E162 Hypoglycemia, unspecified: Secondary | ICD-10-CM

## 2019-12-25 ENCOUNTER — Encounter: Admit: 2019-12-25 | Discharge: 2019-12-25 | Payer: BC Managed Care – PPO

## 2019-12-26 ENCOUNTER — Encounter: Admit: 2019-12-26 | Discharge: 2019-12-26 | Payer: BC Managed Care – PPO

## 2019-12-28 MED ORDER — TAMOXIFEN 20 MG PO TAB
ORAL_TABLET | Freq: Every day | 0 refills | Status: DC
Start: 2019-12-28 — End: 2020-03-24

## 2020-01-14 ENCOUNTER — Encounter: Admit: 2020-01-14 | Discharge: 2020-01-14 | Payer: BC Managed Care – PPO

## 2020-03-04 ENCOUNTER — Encounter: Admit: 2020-03-04 | Discharge: 2020-03-04 | Payer: BC Managed Care – PPO

## 2020-03-10 ENCOUNTER — Encounter: Admit: 2020-03-10 | Discharge: 2020-03-10 | Payer: BC Managed Care – PPO

## 2020-03-10 DIAGNOSIS — M25531 Pain in right wrist: Secondary | ICD-10-CM

## 2020-03-11 ENCOUNTER — Ambulatory Visit: Admit: 2020-03-11 | Discharge: 2020-03-11 | Payer: BC Managed Care – PPO

## 2020-03-11 ENCOUNTER — Encounter: Admit: 2020-03-11 | Discharge: 2020-03-11 | Payer: BC Managed Care – PPO

## 2020-03-11 DIAGNOSIS — K21 Bile reflux esophagitis: Secondary | ICD-10-CM

## 2020-03-11 DIAGNOSIS — C50911 Malignant neoplasm of unspecified site of right female breast: Secondary | ICD-10-CM

## 2020-03-11 DIAGNOSIS — M25531 Pain in right wrist: Secondary | ICD-10-CM

## 2020-03-11 DIAGNOSIS — G478 Other sleep disorders: Secondary | ICD-10-CM

## 2020-03-11 DIAGNOSIS — K2210 Ulcer of esophagus without bleeding: Secondary | ICD-10-CM

## 2020-03-11 DIAGNOSIS — R519 Generalized headaches: Secondary | ICD-10-CM

## 2020-03-11 DIAGNOSIS — G5621 Lesion of ulnar nerve, right upper limb: Secondary | ICD-10-CM

## 2020-03-11 DIAGNOSIS — M7711 Lateral epicondylitis, right elbow: Secondary | ICD-10-CM

## 2020-03-11 DIAGNOSIS — K219 Gastro-esophageal reflux disease without esophagitis: Secondary | ICD-10-CM

## 2020-03-11 DIAGNOSIS — Z923 Personal history of irradiation: Secondary | ICD-10-CM

## 2020-03-11 DIAGNOSIS — E162 Hypoglycemia, unspecified: Secondary | ICD-10-CM

## 2020-03-23 ENCOUNTER — Encounter: Admit: 2020-03-23 | Discharge: 2020-03-23 | Payer: BC Managed Care – PPO

## 2020-03-24 MED ORDER — TAMOXIFEN 20 MG PO TAB
ORAL_TABLET | Freq: Every day | 0 refills | Status: AC
Start: 2020-03-24 — End: ?

## 2020-05-19 ENCOUNTER — Encounter: Admit: 2020-05-19 | Discharge: 2020-05-19 | Payer: BC Managed Care – PPO

## 2020-05-31 ENCOUNTER — Encounter: Admit: 2020-05-31 | Discharge: 2020-05-31 | Payer: BC Managed Care – PPO

## 2020-05-31 DIAGNOSIS — K21 Bile reflux esophagitis: Secondary | ICD-10-CM

## 2020-05-31 DIAGNOSIS — K219 Gastro-esophageal reflux disease without esophagitis: Secondary | ICD-10-CM

## 2020-05-31 DIAGNOSIS — R519 Generalized headaches: Secondary | ICD-10-CM

## 2020-05-31 DIAGNOSIS — E162 Hypoglycemia, unspecified: Secondary | ICD-10-CM

## 2020-05-31 DIAGNOSIS — K2210 Ulcer of esophagus without bleeding: Secondary | ICD-10-CM

## 2020-05-31 DIAGNOSIS — C50911 Malignant neoplasm of unspecified site of right female breast: Secondary | ICD-10-CM

## 2020-05-31 DIAGNOSIS — Z923 Personal history of irradiation: Secondary | ICD-10-CM

## 2020-05-31 DIAGNOSIS — Z1231 Encounter for screening mammogram for malignant neoplasm of breast: Secondary | ICD-10-CM

## 2020-05-31 DIAGNOSIS — G478 Other sleep disorders: Secondary | ICD-10-CM

## 2020-06-01 ENCOUNTER — Encounter: Admit: 2020-06-01 | Discharge: 2020-06-01 | Payer: BC Managed Care – PPO

## 2020-06-01 ENCOUNTER — Ambulatory Visit: Admit: 2020-06-01 | Discharge: 2020-06-01 | Payer: BC Managed Care – PPO

## 2020-06-01 DIAGNOSIS — Z08 Encounter for follow-up examination after completed treatment for malignant neoplasm: Secondary | ICD-10-CM

## 2020-06-01 DIAGNOSIS — G478 Other sleep disorders: Secondary | ICD-10-CM

## 2020-06-01 DIAGNOSIS — K21 Bile reflux esophagitis: Secondary | ICD-10-CM

## 2020-06-01 DIAGNOSIS — E162 Hypoglycemia, unspecified: Secondary | ICD-10-CM

## 2020-06-01 DIAGNOSIS — K2210 Ulcer of esophagus without bleeding: Secondary | ICD-10-CM

## 2020-06-01 DIAGNOSIS — Z923 Personal history of irradiation: Secondary | ICD-10-CM

## 2020-06-01 DIAGNOSIS — Z1231 Encounter for screening mammogram for malignant neoplasm of breast: Secondary | ICD-10-CM

## 2020-06-01 DIAGNOSIS — R519 Generalized headaches: Secondary | ICD-10-CM

## 2020-06-01 DIAGNOSIS — C50411 Malignant neoplasm of upper-outer quadrant of right female breast: Secondary | ICD-10-CM

## 2020-06-01 DIAGNOSIS — R922 Inconclusive mammogram: Secondary | ICD-10-CM

## 2020-06-01 DIAGNOSIS — K219 Gastro-esophageal reflux disease without esophagitis: Secondary | ICD-10-CM

## 2020-06-01 DIAGNOSIS — C50911 Malignant neoplasm of unspecified site of right female breast: Secondary | ICD-10-CM

## 2020-06-01 NOTE — Progress Notes
Reviewed BIS testing results from today   Results   Current 2.5  Baseline -2.9  Change from Baseline 5.4   WNL less than 3 standard deviation increase from baseline.      Notified patient viaMyChart result was normal and to continue with routine follow up as scheduled.  Provided clinic contact information for any questions or concerns.

## 2020-06-01 NOTE — Progress Notes
Bioimpedance Spectroscopy performed. Advised patient that Normal result will be sent within 24 hours by mail or via Mychart (preferred). The patient will be contacted via phone by the lymphedema nurse with any abnormal results.

## 2020-06-01 NOTE — Progress Notes
Name: Victoria Mcknight          MRN: [id number]      DOB: 1971/02/18      AGE: 49 y.o.   DATE OF SERVICE: 06/01/2020                Reason for Visit:  Heme/Onc Care      Victoria Mcknight is a 49 y.o. female.     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    HISTORY:  Victoria Mcknight is a Caucasian female who presented to the [facility] on 06/16/2015 at age 72 for evaluation of newly diagnosed breast cancer.  She had no breast complaints prior to screening mammogram.  She has a history of a left breast cyst aspiration in 2015 at [facility] (cytology benign).  Ultrasound guided biopsy on 06/07/15 ([place]) revealed a grade 2 invasive ductal carcinoma. Victoria Mcknight underwent right RSL lumpectomy/SLNB on 07/08/15. Final pathology revealed a 3.1 cm grade 2 IDC; DCIS present; all margins over 2 mm; LVI absent; 3 negative SLNs.  She started Tamoxifen in 07/2015.  She also took Zoladex up until the time of her BSO in 04/2016.  She completed radiation therapy with Dr. Darcus Austin in [place] on 09/29/15.    BREAST IMAGING:  Mammogram:    -- Bilateral screening mammogram 05/24/15 [facility] [place]) revealed an extremely dense parenchymal pattern which significantly reduced sensitivity of mammography. There was subtle architectural change identified in the upper outer quadrant of the right breast best seen on tomosynthesis. The rest of the parenchyma was stable. There were scattered parenchymal calcifications. Elsewhere there was no new, dominant, nor suspicious parenchymal asymmetry, architectural change or calcification.  -- Right diagnostic mammogram 06/02/15 ([place]) revealed extremely dense breast tissue, a spiculated mass within the superior lateral right breast measuring approximately 2 cm on mammography. Scattered benign calcifications were present within the right breast.    Ultrasound:    -- Right breast ultrasound 06/02/15 ([place]) revealed at 10:00 6 cm from the nipple demonstrated hypoechoic irregular nonparallel spiculated mass measuring 2.2 cm. On one of the antiradial images this may extend to measure 3 cm.     MRI:    -- Bilateral breast MRI 06/02/15 ([place]) revealed extremely dense breast tissue.  RIGHT BREAST: There was a 1.5 cm AP x 1.9 cm transverse x 1.6 CC cm round, heterogeneously enhancing mass at 10:00, posterior depth with predominantly washout enhancement kinetics, which corresponded to the mass of concern on imaging performed yesterday. There was no abnormal enhancement of the pectoralis muscle. 0.5 cm anteroinferior to the suspicious mass, there was a 0.9 x 0.7 cm irregular enhancing mass with predominantly persistent enhancement kinetics. The total inclusive distance of these 2 discontiguous masses was 2.7 cm in AP dimension. No suspicious right axillary or internal mammary lymph nodes were seen. LEFT BREAST: There was no evidence of abnormal enhancement, mass, or axillary/internal mammary adenopathy.  REPRODUCTIVE HEALTH:  Age at first Menarche:  14  Age at First Live Birth:  N/A  Age at Menopause: surgical menopause at 56; s/p hysterectomy in 2009 and BSO in 2017  Gravida:  0  Para: 0  Breastfeeding:  N/A    PROCEDURES: Right RSL lumpectomy/SLNB, 07/08/15  PERTINENT PMH:  None  FAMILY HISTORY:  Mother diagnosed with breast cancer at age 28; Maternal aunt diagnosed with breast cancer in her 71's; Maternal aunt diagnosed with breast cancer in her 68's  PHYSICAL EXAM on PRESENTATION: Biopsy site changes and hematoma at 10:00 right breast.  Dense breast tissue with benign nodularity noted at 6:00 in the left breast.  No supraclavicular or axillary adenopathy.    MEDICAL ONCOLOGY:  Dr. Welton Flakes  PRESENT THERAPY: Tamoxifen started 07/2015; Zoladex was stopped following BSO in July 2017  REFERRED BY:  Dr. Burnadette Pop       Review of Systems    Constitutional: Negative for fever, chills, appetite change and fatigue.   HENT: Negative for hearing loss, congestion, and tinnitus.  postnasal drip, rhinorrhea  Eyes: Negative for pain, discharge and itching.   Respiratory: Negative for cough, chest tightness and shortness of breath.    Cardiovascular: Negative for chest pain and palpitations.   Gastrointestinal: Negative for abdominal distention, pain, nausea, vomiting, and diarrhea.   Genitourinary: Negative for frequency, vaginal bleeding, difficulty urinating and pelvic pain.   Musculoskeletal: Negative for myalgias, joint swelling and arthralgias. back pain  Skin: Negative for rash.   Neurological: Negative for dizziness, weakness, light-headedness and headaches.   Hematological: Does not bruise/bleed easily.   Psychiatric/Behavioral: Negative for disturbed wake/sleep cycle. The patient is not nervous/anxious.    Allergies   Allergen Reactions   ? Decongestant [Pseudoephedrine Hcl] SEE COMMENTS     Tachycardia   ? Feathers EYE IRRITATION   ? Doxycycline STOMACH UPSET   ? Mold ITCHING   ? Naproxen SEE COMMENTS     inner ear crystal formation       Medical History:   Diagnosis Date   ? Bile reflux esophagitis    ? Generalized headaches    ? GERD (gastroesophageal reflux disease)    ? History of radiation therapy    ? Hypoglycemia    ? Malignant neoplasm of right breast (HCC) 06/07/15   ? Ulcer of esophagus    ? Upper airway resistance syndrome      Surgical History:   Procedure Laterality Date   ? BUNIONECTOMY Bilateral 2007   ? HX HYSTERECTOMY  2009   ? CHOLECYSTECTOMY  2015   ? BREAST BIOPSY Right 06/07/2015   ? Right radioactive seed localized lumpectomy, sentinel lymph node biopsy Radiotracer to be injected in OR by Dr. Nelson Chimes at 0900  Right 07/08/2015    Performed by Cordelia Poche, MD at [facility] OR   ? BILATERAL SALPINGO-OOPHORECTOMY  2017   ? LAPAROSCOPIC BILATERAL SALPINGO-OOPHORECTOMY Bilateral 04/17/2016    Performed by Pernell Dupre, MD at [facility] OR     Family History   Problem Relation Age of Onset   ? Cancer Mother 81        Breast cancer   ? Diabetes Mother    ? Cancer-Breast Mother    ? Cancer Other 60        M-aunt breast cancer   ? Cancer-Breast Other    ? Cancer Other 40        M-aunt breast cancer   ? Cancer-Breast Other    ? Heart Attack  Neg Hx    ? Stroke Neg Hx    ? Cervical Cancer Neg Hx    ? Cancer-Colon Neg Hx    ? Cancer-Ovarian Neg Hx    ? Heart Disease Neg Hx    ? Hypertension Neg Hx      Social History     Socioeconomic History   ? Marital status: Married     Spouse name: Not on file   ? Number of children: Not on file   ? Years of education: Not on file   ? Highest education level: Not on file   Occupational History   ? Not on file   Tobacco Use   ? Smoking status: Never Smoker   ? Smokeless tobacco: Never Used   ? Tobacco comment: 5.11.17   Substance and Sexual Activity   ? Alcohol use: No     Alcohol/week: 0.0 standard drinks   ? Drug use: No   ? Sexual activity: Yes     Partners: Male     Birth control/protection: Surgical, None   Other Topics Concern   ? Not on file   Social History Narrative   ? Not on file                     Objective:         ? azelastine(+) (ASTELIN) 137 mcg (0.1 %) nasal spray Apply two sprays to each nostril as directed twice daily. Use in each nostril as directed   ? butalbital/acetaminophen/caffeine(+) (FIORICET) 50/325/40 mg tablet Take 1 Tab by mouth every 4 hours as needed for Headache.   ? calcium citrate (CALCITRATE) 950 mg tab Take 950 mg by mouth daily.   ? EPINEPHrine (EPIPEN 2-PAK) 1 mg/mL injection pen (2-Pack) Inject 0.3 mg (1 Pen) into thigh if needed for anaphylactic reaction. May repeat in 5-15 minutes if needed.   ? esomeprazole DR(+) (NEXIUM) 40 mg capsule Take 20 mg by mouth daily. Take on an empty stomach at least 1 hour before or 2 hours after food.    ? fluticasone (FLONASE) 50 mcg/actuation nasal spray Apply  to each nostril as directed daily. Shake bottle gently before using.   ? gabapentin (NEURONTIN) 300 mg capsule TAKE 1 CAPSULE BY MOUTH ONCE DAILY AT BEDTIME TAKE 30 MINUTES PRIOR TO BEDTIME   ? Glucosamine-Chondroit-Vit C-Mn (GLUCOSAMINE CHONDROITIN MAXSTR) 500-400 mg cap Take 1 Cap by mouth daily.   ? ipratropium bromide (ATROVENT) 42 mcg (0.06 %) nasal spray Apply two sprays to each nostril as directed three times daily.   ? Lactobacillus rhamnosus GG (LACTOBACILLUS RHAMNOSUS (GG)) 15 billion cell cpSP Take 1 Cap by mouth as directed daily with breakfast.   ? montelukast (SINGULAIR) 10 mg tablet Take one tablet by mouth at bedtime daily.   ? mupirocin (BACTROBAN) 2 % topical ointment Apply  topically to affected area three times daily.   ? promethazine (PHENERGAN) 12.5 mg tablet Take 12.5 mg by mouth every 6 hours as needed for Nausea.   ? tamoxifen (NOLVADEX) 20 mg tablet Take 1 tablet by mouth once daily   ? vitamin E 400 unit capsule Take 400 Units by mouth daily.   ? vitamins, multiple tablet Take 1 Tab by mouth daily.     Vitals:    06/01/20 1241   BP: 128/85   BP Source: Arm, Left Upper   Patient Position: Sitting   Pulse: 87   Resp: 14   Temp: 36.3 ?C (97.3 ?F)   TempSrc:  Temporal   SpO2: 100%   Weight: 54.9 kg (121 lb)   Height: 163.8 cm (64.5)   PainSc: Zero     Body mass index is 20.45 kg/m?[name]     Pain Score: Zero       Fatigue Scale: 0-None    Pain Addressed:  N/A    Patient Evaluated for a Clinical Trial: No treatment clinical trial available for this patient.     [place] Cooperative Oncology Group performance status is 0, Fully active, able to carry on all pre-disease performance without restriction.[name]     Physical Exam  Vitals reviewed.     RIGHT BREAST EXAM:  Breast:  S/p lumpectomy/SLNB. No palpable masses. Minimal radiation skin changes  Skin Erythema:  No  Attachment of Overlying Skin:  No  Peau d' orange:  No  Chest Wall Attachment:  No  Nipple Inversion:  No  Nipple Discharge: No    LEFT BREAST EXAM:  Breast: No palpable masses  Skin Erythema:  No  Attachment of Overlying Skin:  No  Peau d' orange:  No  Chest Wall Attachment: No  Nipple Inversion:  No  Nipple Discharge:  No    RIGHT NODAL BASIN EXAM:  Axillary:  negative  Infraclavicular:  negative  Supraclavicular:  negative    LEFT NODAL BASIN EXAM:  Axillary:  negative  Infraclavicular: negative  Supraclavicular:  negative      Constitutional: No acute distress.  HEENT:  Head: Normocephalic and atraumatic.  Eyes: No discharge. No scleral icterus.  Pulmonary/Chest: No respiratory distress.   Neurological: Alert and oriented to person, place and time. No cranial nerve deficit.  Skin: Warm and dry. No rash noted. No erythema. No pallor.  Psychiatric: Normal mood and affect. Behavior is normal. Judgement and thought content normal.            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 She denies any changes in her medical or family history. She has no new breast complaints. She continues on tamoxifen with Dr. Welton Flakes and had a follow up with him yesterday. He is planning to keep her on it for 7 years. She had a BIS today for arm lymphedema surveillance and has no new arm complaints. We can discontinue BIS at this time since she has had no evidence of arm lymphedema. We can also schedule one in the future if she has any new arm complaints. Victoria Mcknight is scheduled for bilateral screening mammogram and ultrasound today and I will call her with the results. I will have her continue Survivorship with Dr. Welton Flakes. I will have him order her future mammogram and ultrasound to coordinate with appointments. She prefer handheld screening ultrasound to ABUS and this can be done at [facility] as well. She may return to the clinic as needed. She was given ample time to ask questions all of which were answered to her satisfaction. She was encouraged to call with any interval questions or concerns.    1. Continue follow up with Dr. Welton Flakes  2. Bilateral screening mammogram and ultrasound today  3. Discontinue BIS  4. Bilateral screening mammogram and ultrasound in 1 year - order by Dr. Welton Flakes  5. RTC PRN    Guy Begin, PA-C    ADDENDUM 06/01/20:  LM for Victoria Mcknight letting her know her mammogram and ultrasound were negative. Callback  provided with questions.    DIGITAL MAMMO SCREEN BILAT/TOMO/CAD: June 01, 2020 -   ACCESSION #: [id number]   3D Procedure   3D Routine views.   2D Synthetic Routine views.     Prior study comparison: June 02, 2019, bilateral [id number]   DIGITAL MAMMO SCREEN BILAT/TOMO/CAD, performed at [facility]   of [facility]. ?January 16, 2018, bilateral [id number]   DIGITAL MAMMO SCREEN BILAT/TOMO/CAD performed at [facility]   of [facility]. ?January 17, 2017, bilateral [id number]   DIGITAL MAMMO SCREEN BILAT/TOMO/CAD performed at [facility]   of [facility]. ?January 17, 2016, bilateral [id number] MAMMO   DIAGNOSTIC BIL/TOMO, performed at [facility].     The breasts are extremely dense, which lowers the sensitivity of   mammography. The breast tissue is very dense, which is a risk   factor for breast cancer and lowers the sensitivity of detection   of breast cancer. This patient may benefit from supplementary   screening with ultrasound. This procedure will require a   physician order. Scheduling phone # is [phone]. Similar   information has been provided in the lay letter to this patient.   (Please disregard if screening ultrasound or MRI has already been   done.) ?3-D and synthetic 2-D images of the bilateral breasts   were obtained.     There are stable post therapeutic changes in the right breast. No   new suspicious masses, calcifications, or areas of architectural   distortion are identified in either breast. There is no   significant change when compared with prior mammograms.     [id number] [place] HAND-HELD COMPLETE BREAST SCR BIL: June 01, 2020 -   ACCESSION #: [id number]   Standard views.     Technologist: Oletha Cruel, Sonographer   Complete hand-held screening ultrasound of both breasts was   performed in the setting of dense breast tissue.     No suspicious solid or cystic mass is identified in either breast   on screening ultrasound.       Finalized by Konrad Saha, M.D. on 06/01/2020 2:14 PM.   Dictated by Konrad Saha, M.D. on 06/01/2020 2:12 PM.       Electronically signed and approved by: Konrad Saha, M.D.   [id number]   IMPRESSION     ASSESSMENT: BIRAD 2-Benign (Overall)   SCREEN B/T/C: Terance Hart 2-benign finding.   [place] H-HELD BILAT: BIRAD 1-negative.         RECOMMENDATION:   Routine screening mammogram of both breasts in 1 year.     Guy Begin, PA-C

## 2020-06-03 ENCOUNTER — Encounter: Admit: 2020-06-03 | Discharge: 2020-06-03 | Payer: BC Managed Care – PPO

## 2020-06-18 ENCOUNTER — Encounter: Admit: 2020-06-18 | Discharge: 2020-06-18 | Payer: BC Managed Care – PPO

## 2020-06-18 MED ORDER — TAMOXIFEN 20 MG PO TAB
ORAL_TABLET | Freq: Every day | 0 refills
Start: 2020-06-18 — End: ?

## 2020-06-21 ENCOUNTER — Ambulatory Visit: Admit: 2020-06-21 | Discharge: 2020-06-22 | Payer: BC Managed Care – PPO

## 2020-06-21 ENCOUNTER — Encounter: Admit: 2020-06-21 | Discharge: 2020-06-21 | Payer: BC Managed Care – PPO

## 2020-06-21 DIAGNOSIS — G478 Other sleep disorders: Secondary | ICD-10-CM

## 2020-06-21 DIAGNOSIS — K2210 Ulcer of esophagus without bleeding: Secondary | ICD-10-CM

## 2020-06-21 DIAGNOSIS — K219 Gastro-esophageal reflux disease without esophagitis: Secondary | ICD-10-CM

## 2020-06-21 DIAGNOSIS — Z923 Personal history of irradiation: Secondary | ICD-10-CM

## 2020-06-21 DIAGNOSIS — Z01419 Encounter for gynecological examination (general) (routine) without abnormal findings: Secondary | ICD-10-CM

## 2020-06-21 DIAGNOSIS — K21 Bile reflux esophagitis: Secondary | ICD-10-CM

## 2020-06-21 DIAGNOSIS — C50911 Malignant neoplasm of unspecified site of right female breast: Secondary | ICD-10-CM

## 2020-06-21 DIAGNOSIS — R519 Generalized headaches: Secondary | ICD-10-CM

## 2020-06-21 DIAGNOSIS — E162 Hypoglycemia, unspecified: Secondary | ICD-10-CM

## 2020-06-21 NOTE — Progress Notes
Date of Service: 06/21/2020    Subjective:             Victoria Mcknight is a 49 y.o. female.    History of Present Illness       Review of Systems   Constitutional: Positive for fatigue. Negative for fever and unexpected weight change.   HENT: Negative for voice change.    Respiratory: Negative for cough and shortness of breath.    Cardiovascular: Negative for chest pain and leg swelling.   Gastrointestinal: Positive for constipation. Negative for abdominal pain, blood in stool, diarrhea, nausea and vomiting.   Endocrine: Negative for heat intolerance.   Genitourinary: Positive for enuresis. Negative for difficulty urinating, dyspareunia, dysuria, frequency, genital sores, hematuria, menstrual problem, pelvic pain, urgency, vaginal bleeding, vaginal discharge and vaginal pain.   Musculoskeletal: Positive for back pain. Negative for arthralgias.   Skin: Negative for rash.   Allergic/Immunologic: Positive for environmental allergies. Negative for food allergies and immunocompromised state.   Neurological: Positive for headaches. Negative for light-headedness.   Hematological: Negative for adenopathy. Does not bruise/bleed easily.   Psychiatric/Behavioral: Negative for confusion. The patient is not nervous/anxious.          Objective:         ? azelastine(+) (ASTELIN) 137 mcg (0.1 %) nasal spray Apply two sprays to each nostril as directed twice daily. Use in each nostril as directed   ? butalbital/acetaminophen/caffeine(+) (FIORICET) 50/325/40 mg tablet Take 1 Tab by mouth every 4 hours as needed for Headache.   ? calcium citrate (CALCITRATE) 950 mg tab Take 950 mg by mouth daily.   ? EPINEPHrine (EPIPEN 2-PAK) 1 mg/mL injection pen (2-Pack) Inject 0.3 mg (1 Pen) into thigh if needed for anaphylactic reaction. May repeat in 5-15 minutes if needed.   ? esomeprazole DR(+) (NEXIUM) 40 mg capsule Take 20 mg by mouth daily. Take on an empty stomach at least 1 hour before or 2 hours after food.    ? fluticasone (FLONASE) 50 mcg/actuation nasal spray Apply  to each nostril as directed daily. Shake bottle gently before using.   ? gabapentin (NEURONTIN) 300 mg capsule TAKE 1 CAPSULE BY MOUTH ONCE DAILY AT BEDTIME TAKE 30 MINUTES PRIOR TO BEDTIME   ? Glucosamine-Chondroit-Vit C-Mn (GLUCOSAMINE CHONDROITIN MAXSTR) 500-400 mg cap Take 1 Cap by mouth daily.   ? ipratropium bromide (ATROVENT) 42 mcg (0.06 %) nasal spray Apply two sprays to each nostril as directed three times daily.   ? Lactobacillus rhamnosus GG (LACTOBACILLUS RHAMNOSUS (GG)) 15 billion cell cpSP Take 1 Cap by mouth as directed daily with breakfast.   ? montelukast (SINGULAIR) 10 mg tablet Take one tablet by mouth at bedtime daily.   ? mupirocin (BACTROBAN) 2 % topical ointment Apply  topically to affected area three times daily.   ? promethazine (PHENERGAN) 12.5 mg tablet Take 12.5 mg by mouth every 6 hours as needed for Nausea.   ? tamoxifen (NOLVADEX) 20 mg tablet Take 1 tablet by mouth once daily   ? vitamin E 400 unit capsule Take 400 Units by mouth daily.   ? vitamins, multiple tablet Take 1 Tab by mouth daily.     There were no vitals filed for this visit.  There is no height or weight on file to calculate BMI.     Physical Exam         Assessment and Plan:

## 2020-06-22 NOTE — Progress Notes
Date of Service: 06/21/2020    Victoria Mcknight is a 49 y.o. female.  DOB: 08/19/1971  MRN: [id number]       Subjective:            Chief Complaint   Patient presents with   ? Well Woman            History of Present Illness      Pt here for Well Woman Exam.  She follows with PCP and oncology.     Gynecologic History  LMP: No LMP recorded. Patient has had a hysterectomy.    Sexually active: yes, one female partner  History of abnormal paps:  No  STD history: no past history    Menopause surgical, age 15.  Menopause symptoms: occasional hot flashes, but not concerned about these  Hormones: no- would like to avoid due to breast cancer history.   Post-menopausal bleeding: none    Surveillance  Pap: N/A  Last mammogram: 05/2020, normal  Gardasil: no  TDAP:2018  COVID 19:vaccinated  Influenza:not indicated at visit time    Last colonoscopy: not indicated at visit time  Lipid screen: follows with PCP      Health Promotion  Safe at home: yes  Sunscreen: yes  Seatbelt: always  Diet:healthy  Exercise: yes  Tobacco use:no  Psych: denies        Victoria Mcknight has a past medical history of Bile reflux esophagitis, Generalized headaches, GERD (gastroesophageal reflux disease), History of radiation therapy, Hypoglycemia, Malignant neoplasm of right breast (HCC) (06/07/15), Ulcer of esophagus, and Upper airway resistance syndrome.    She has a past surgical history that includes hysterectomy (2009); breast biopsy (Right, 06/07/2015); bunionectomy (Bilateral, 2007); cholecystectomy (2015); bilateral salpingo-oophorectomy (2017); salpingo-oophorectomy (Bilateral, 04/17/2016) (LAPAROSCOPIC BILATERAL SALPINGO-OOPHORECTOMY performed by Pernell Dupre, MD at [facility]); and breast lumpectomy (Right, 07/08/2015) (Right radioactive seed localized lumpectomy, sentinel lymph node biopsy Radiotracer to be injected in OR by Dr. Nelson Chimes at 0900  performed by Cordelia Poche, MD at [facility]).    Victoria Mcknight's   family history includes Cancer (age of onset: 22) in her mother; Cancer (age of onset: 47) in an other family member; Cancer (age of onset: 8) in an other family member; Cancer-Breast in her mother and other family members; Diabetes in her mother.         Review of Systems   Constitutional: Negative for fever.           Objective:         ? azelastine(+) (ASTELIN) 137 mcg (0.1 %) nasal spray Apply two sprays to each nostril as directed twice daily. Use in each nostril as directed   ? butalbital/acetaminophen/caffeine(+) (FIORICET) 50/325/40 mg tablet Take 1 Tab by mouth every 4 hours as needed for Headache.   ? calcium citrate (CALCITRATE) 950 mg tab Take 950 mg by mouth daily.   ? EPINEPHrine (EPIPEN 2-PAK) 1 mg/mL injection pen (2-Pack) Inject 0.3 mg (1 Pen) into thigh if needed for anaphylactic reaction. May repeat in 5-15 minutes if needed.   ? esomeprazole DR(+) (NEXIUM) 40 mg capsule Take 20 mg by mouth daily. Take on an empty stomach at least 1 hour before or 2 hours after food.    ? fluticasone (FLONASE) 50 mcg/actuation nasal spray Apply  to each nostril as directed daily. Shake bottle gently before using.   ? gabapentin (NEURONTIN) 300 mg capsule TAKE 1 CAPSULE BY MOUTH ONCE DAILY AT BEDTIME TAKE 30 MINUTES PRIOR TO BEDTIME   ?  Glucosamine-Chondroit-Vit C-Mn (GLUCOSAMINE CHONDROITIN MAXSTR) 500-400 mg cap Take 1 Cap by mouth daily.   ? ipratropium bromide (ATROVENT) 42 mcg (0.06 %) nasal spray Apply two sprays to each nostril as directed three times daily.   ? Lactobacillus rhamnosus GG (LACTOBACILLUS RHAMNOSUS (GG)) 15 billion cell cpSP Take 1 Cap by mouth as directed daily with breakfast.   ? montelukast (SINGULAIR) 10 mg tablet Take one tablet by mouth at bedtime daily.   ? mupirocin (BACTROBAN) 2 % topical ointment Apply  topically to affected area three times daily.   ? promethazine (PHENERGAN) 12.5 mg tablet Take 12.5 mg by mouth every 6 hours as needed for Nausea.   ? tamoxifen (NOLVADEX) 20 mg tablet Take 1 tablet by mouth once daily ? vitamin E 400 unit capsule Take 400 Units by mouth daily.   ? vitamins, multiple tablet Take 1 Tab by mouth daily.     Vitals:    06/21/20 0900 06/21/20 0904   BP: (!) 116/103 117/68   BP Source: Arm, Left Upper Arm, Left Upper   Patient Position: Sitting Sitting   Pulse: 72    Temp: 36.8 ?C (98.2 ?F)    TempSrc: Oral    Weight: 55 kg (121 lb 3.2 oz)    Height: 163.8 cm (64.49)    PainSc: Two      Body mass index is 20.49 kg/m?[name]     Physical Exam  Vitals and nursing note reviewed. Exam conducted with a chaperone present.   Constitutional:       General: She is not in acute distress.     Appearance: Normal appearance. She is well-developed. She is not ill-appearing, toxic-appearing or diaphoretic.   HENT:      Head: Normocephalic and atraumatic.   Eyes:      Pupils: Pupils are equal, round, and reactive to light.   Cardiovascular:      Rate and Rhythm: Normal rate.   Pulmonary:      Effort: Pulmonary effort is normal.   Chest:      Breasts:         Right: Normal.         Left: Normal.   Abdominal:      Palpations: Abdomen is soft.      Tenderness: There is no abdominal tenderness.   Genitourinary:     General: Normal vulva.      Labia:         Right: No rash, tenderness, lesion or injury.         Left: No rash, tenderness, lesion or injury.    Musculoskeletal:         General: Normal range of motion.      Cervical back: Normal range of motion and neck supple.   Lymphadenopathy:      Upper Body:      Right upper body: No supraclavicular, axillary or pectoral adenopathy.      Left upper body: No supraclavicular, axillary or pectoral adenopathy.      Lower Body: No right inguinal adenopathy. No left inguinal adenopathy.   Skin:     General: Skin is warm.   Neurological:      Mental Status: She is alert and oriented to person, place, and time.   Psychiatric:         Mood and Affect: Mood normal.         Speech: Speech normal.         Behavior: Behavior normal.  Thought Content: Thought content normal. Judgment: Judgment normal.              Assessment and Plan:        Discussed: Breast Self Awareness, recommendations for follow-up. Recommended yearly visit with PCP.                 Raynelle Fanning A. Hofmann was seen today for well woman.    Diagnoses and all orders for this visit:    Well woman exam               Patient Instructions     Vulvar Skin Care  1. Wash genital area with water only. When you need to use soap, use a hypoallergenic, unscented soap such as Aveeno Skin Relief for Sensitive Skin. Do not use any soap when your skin feels irritated  2.White all cotton underwear  3. No fabric softeners or dryer sheets on underwear  4. Perfume free dye free laundry detergent Use All Free and Clear laundry detergent  5. Stop shaving - if you must remove hair,waxing or laser preferable  6. Seal your skin after bathing to protect from contact irritants: use a bland perfume free emollient/moisturizer such ZO:XWRUEAVW, Aquafor, Emu oil, coconut oil..your choice for what feels soothing        General Instructions  For prompt and efficient communication, MyChart is preferred. Please sign up.  [email or web address]      To Contact Me:   Please send a MyChart message to your doctor or call [phone] to reach your doctor's nurse team. Please only call once in a 24-hour period. Leave a voicemail for the nurse to respond.  Calls or messages received after 4pm will be returned the next business day.  To Have Medication Refilled:   Please use the MyChart Refill request or contact your pharmacy directly to request medication refills. Please allow 72 hours.  To Receive Your Test Results:  ? Please allow 2 business day for labs to result in MyChart.  ? Please allow 4 business days for other tests, including cardiac studies, blood bank, and radiology results.  ? It can take up to 10 days for pathology  Our Lab (Location, Hours, and Fasting Information)  Lab Location: the 1st floor of the Medical Office Building, directly to the left of the Information Desk.  Lab Hours: Monday 6:30 am-6 pm, Tuesday-Friday 7 am-6 pm, and Saturday 7 am-noon.   Fasting for Lab: For fasting labs, please:   Do not eat for at least 8-10 hours before having your labs drawn, drink plenty of water, and make sure to continue your medications as prescribed (unless otherwise specified).  Radiology is on the 2nd floor of the Medical Office Building. Please call [phone], option #1; Mammogram at [facility] location, please call [phone], option #1.  To Schedule an Appointment:   Contact our schedulers at [phone] and select #1 to make an appointment. At this time, you will be encouraged to signed up to MyChart if you not active.     To Receive Appointment Reminders on Your Cell Phone:   Make sure we have your cell phone number, and text [place] to [phone].  For Urgent Questions:   For medical emergencies, call 911.  On nights, weekends or holidays, call the Hospital operator at [phone], and ask for the ?Ob/Gyn Physician on call or Certified Nurse Midwife on call.?        We value your feedback and you may receive a survey about your  experience with our office. If you had a favorable experience, the only positive survey results that we will receive are if we are rated in the 9 or 10 range out of 10 points. Please let us know why we did not earn 9-10 rating.  Thank you.      For up to date information on the COVID-19 virus, visit the [facility] website. [email or web address]  ? General supportive care during cold and flu season and infection prevention reminders:    o Wash hands often with soap and water for at least 20 seconds   o Cover your mouth and nose   o Social distancing: try to maintain 6 feet between you and other people   o Stay home if sick and symptoms mild or manageable?  ? If you must be around people wear a mask    ? If you are having symptoms of a lower respiratory infection (cough, shortness of breath) and/or fever AND either traveled in last 30 days (internationally or to region of exposure) OR known exposure to patient with COVID19:     o Call your primary care provider for questions or health needs.   ? Tell your doctor about your recent travel and your symptoms     o In a medical emergency, call 911 or go to the nearest emergency room.          Edgar Frisk, APRN  Dr. Tami Ribas, collaborator

## 2020-08-08 ENCOUNTER — Encounter: Admit: 2020-08-08 | Discharge: 2020-08-08 | Payer: BC Managed Care – PPO

## 2020-08-08 MED ORDER — IPRATROPIUM BROMIDE 42 MCG (0.06 %) NA SPRY
Freq: Three times a day (TID) | 0 refills
Start: 2020-08-08 — End: ?

## 2020-08-12 ENCOUNTER — Encounter: Admit: 2020-08-12 | Discharge: 2020-08-12 | Payer: BC Managed Care – PPO

## 2020-09-01 ENCOUNTER — Encounter: Admit: 2020-09-01 | Discharge: 2020-09-01 | Payer: BC Managed Care – PPO

## 2020-09-10 ENCOUNTER — Encounter: Admit: 2020-09-10 | Discharge: 2020-09-10 | Payer: BC Managed Care – PPO

## 2020-09-10 MED ORDER — TAMOXIFEN 20 MG PO TAB
ORAL_TABLET | Freq: Every day | 0 refills
Start: 2020-09-10 — End: ?

## 2020-12-06 ENCOUNTER — Encounter

## 2020-12-06 DIAGNOSIS — Z923 Personal history of irradiation: Secondary | ICD-10-CM

## 2020-12-06 DIAGNOSIS — E162 Hypoglycemia, unspecified: Secondary | ICD-10-CM

## 2020-12-06 DIAGNOSIS — C50911 Malignant neoplasm of unspecified site of right female breast: Secondary | ICD-10-CM

## 2020-12-06 DIAGNOSIS — Z1231 Encounter for screening mammogram for malignant neoplasm of breast: Secondary | ICD-10-CM

## 2020-12-06 DIAGNOSIS — R519 Generalized headaches: Secondary | ICD-10-CM

## 2020-12-06 DIAGNOSIS — G478 Other sleep disorders: Secondary | ICD-10-CM

## 2020-12-06 DIAGNOSIS — K21 Bile reflux esophagitis: Secondary | ICD-10-CM

## 2020-12-06 DIAGNOSIS — K2210 Ulcer of esophagus without bleeding: Secondary | ICD-10-CM

## 2020-12-06 DIAGNOSIS — K219 Gastro-esophageal reflux disease without esophagitis: Secondary | ICD-10-CM

## 2020-12-06 MED ORDER — TAMOXIFEN 20 MG PO TAB
20 mg | ORAL_TABLET | Freq: Every day | ORAL | 3 refills | Status: AC
Start: 2020-12-06 — End: ?

## 2020-12-06 NOTE — Progress Notes
Date of Service: 12/06/2020      Subjective:             Reason for Visit:  Cancer      Victoria Mcknight is a 50 y.o. female.    Cancer Staging  Malignant neoplasm of upper-outer quadrant of right breast in female, estrogen receptor positive (HCC)  Staging form: Breast, AJCC 7th Edition  - Clinical: Stage IA (T1c, N0, cM0) - Signed by Judye Bos, PA-C on 06/15/2015  - Pathologic stage from 07/19/2015: Stage IIA (T2, N0, cM0) - Signed by Dimas Alexandria, PA-C on 07/19/2015      History of Present Illness    DIAGNOSIS: Right breast cancer     PAST ONCOLOGY HISTORY: Victoria Mcknight is a 50 year old female who underwent a routine bilateral screening mammogram on 05/24/15 [facility]) that revealed mass in upper outer quadrant of the right breast. On 06/02/15 she underwent a right mammogram ([place]) that revealed 2cm mass. Right ultrasound ([place])revealed at 10:00 6 cm from the   nipple demonstrates hypoechoic irregular nonparallel spiculated mass measuring 2.2 cm. On one of the antiradial images this may extend to measure 3 cm. This can be further characterized on MRI. Biopsy of this right breast mass will be needed.  MRI breast on 06/02/15 ([place]) revealed in the right breast a 1.5 cm AP x 1.9 cm transverse x 1.6 CC cm round, heterogeneously enhancing mass at 10:00, posterior depth with predominantly washout enhancement kinetics, which corresponds to the mass of concern. There is no abnormal enhancement of the pectoralis muscle. 0.5 cm anteroinferior to the suspicious mass, there is a 0.9 x 0.7 cm irregular enhancing mass with predominantly persistent enhancement kinetics. The total inclusive distance of these 2 discontiguous masses is 2.7 cm in AP dimension. No suspicious right axillary or internal mammary lymph nodes are seen. In the left breast there is no evidence of abnormal enhancement, mass, or axillary/internal mammary adenopathy. On 06/07/15 () she underwent a right breast biopsy that revealed IDC, grade II, ER 99%, PR 100%, HER2 1+ IHC, FISH 1.1, Ki67 18%. On 07/08/15 she underwent a right lumpectomy and SLNB. Pathology revealed IDC, grade II, measuring 3.1cm, 0 LN involved (0/3). Oncotype DX score 11. Her 10-year risk of distant recurrence with Tamoxifen is estimated at 7%.     OB/GYN HISTORY: Age at onset of menstruation 11-12. G0P0. She took birth control from age [age]. LMP 2009 with hysterectomy. Bilateral ovaries intact. She never took HRT.     PRESENT THERAPY: Tamoxifen 20mg  daily 07/2015    Interval History:   Victoria. Mcknight presents for routine follow up  .    Review of Systems   Constitutional: Positive for fatigue.        No hot flashes    Genitourinary:        Post Menopausal   LMP 2009 S/P hysterectomy   BSO 04/17/16   Musculoskeletal: Positive for back pain.        Epidural    All other systems reviewed and are negative.        Allergies   Allergen Reactions   ? Decongestant [Pseudoephedrine Hcl] SEE COMMENTS     Tachycardia   ? Feathers EYE IRRITATION   ? Doxycycline STOMACH UPSET   ? Mold ITCHING   ? Naproxen SEE COMMENTS     inner ear crystal formation       Objective:         ? azelastine(+) (ASTELIN) 137 mcg (0.1 %) nasal  spray Apply two sprays to each nostril as directed twice daily. Use in each nostril as directed   ? butalbital/acetaminophen/caffeine(+) (FIORICET) 50/325/40 mg tablet Take 1 Tab by mouth every 4 hours as needed for Headache.   ? calcium citrate (CALCITRATE) 950 mg tab Take 950 mg by mouth daily.   ? EPINEPHrine (EPIPEN 2-PAK) 1 mg/mL injection pen (2-Pack) Inject 0.3 mg (1 Pen) into thigh if needed for anaphylactic reaction. May repeat in 5-15 minutes if needed.   ? esomeprazole DR(+) (NEXIUM) 40 mg capsule Take 20 mg by mouth daily. Take on an empty stomach at least 1 hour before or 2 hours after food.    ? fluticasone (FLONASE) 50 mcg/actuation nasal spray Apply  to each nostril as directed daily. Shake bottle gently before using.   ? gabapentin (NEURONTIN) 300 mg capsule TAKE 1 CAPSULE BY MOUTH ONCE DAILY AT BEDTIME TAKE 30 MINUTES PRIOR TO BEDTIME   ? Glucosamine-Chondroit-Vit C-Mn (GLUCOSAMINE CHONDROITIN MAXSTR) 500-400 mg cap Take 1 Cap by mouth daily.   ? ipratropium bromide (ATROVENT) 42 mcg (0.06 %) nasal spray USE 2 SPRAY(S) IN EACH NOSTRIL THREE TIMES DAILY AS DIRECTED   ? Lactobacillus rhamnosus GG (LACTOBACILLUS RHAMNOSUS (GG)) 15 billion cell cpSP Take 1 Cap by mouth as directed daily with breakfast.   ? montelukast (SINGULAIR) 10 mg tablet Take one tablet by mouth at bedtime daily.   ? mupirocin (BACTROBAN) 2 % topical ointment Apply  topically to affected area three times daily.   ? promethazine (PHENERGAN) 12.5 mg tablet Take 12.5 mg by mouth every 6 hours as needed for Nausea.   ? tamoxifen (NOLVADEX) 20 mg tablet Take one tablet by mouth daily.   ? vitamin E 400 unit capsule Take 400 Units by mouth daily.   ? vitamins, multiple tablet Take 1 Tab by mouth daily.       Body mass index is 21.15 kg/m?[name]     Pain Score: Two  Pain Loc: Back      Pain Addressed:  N/A    Patient Evaluated for a Clinical Trial: No treatment clinical trial available for this patient.     [place] Cooperative Oncology Group performance status is 0, Fully active, able to carry on all pre-disease performance without restriction.[name]     Physical Exam  Vitals and nursing note reviewed.   Constitutional:       Appearance: She is well-developed.   Pulmonary:      Effort: No respiratory distress.   Chest:   Breasts:      Right: No inverted nipple, mass, nipple discharge, skin change, tenderness or supraclavicular adenopathy.      Left: No inverted nipple, mass, nipple discharge, skin change, tenderness or supraclavicular adenopathy.         Lymphadenopathy:      Cervical: No cervical adenopathy.      Upper Body:      Right upper body: No supraclavicular adenopathy.      Left upper body: No supraclavicular adenopathy.       RADIOLOGY:  Bilateral screening mammogram 06/01/2020: BIRAD 2-benign  Bilateral ABUS 06/01/2020: BIRAD 1-negative          Assessment and Plan:  1. 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  2. We have reviewed the patient's radiology and pathology results in detail with her and her husband.  We have reviewed the particular features of her cancer including stage, grade, hormone receptor and  HER2 neu status.   3. Due to family history; genetic testing. Negative.  4. Oncotype DX score 11. Her 10-year risk of distant recurrence with Tamoxifen is estimated at 7%.  We are not recommending chemotherapy at this time. We are recommending 7 years of endocrine therapy. She was started on Tamoxifen 20mg  daily, 07/2015. Estradiol was elevated so we recommended Zoladex every 3 months for 5 years. BSO 03/2016; so we stopped Zoladex.  She stopped Tamoxifen for 2 weeks for increased joint pain 05/2019. She was not sure if the joint pain was from added stress or the medication. Her joint pain has improved since stopping the Tamoxifen. She then restarted Tamoxifen and will continue for 10 years. She was again told the side effects including hot flashes, cataracts, blood clots and uterine cancer. She will continue to see GYN for annual pelvis exam.  She was given a refill today.   5. Completed radiation in [place] with Dr Darcus Austin 09/29/15.   6. Bilateral screening mammogram due 05/2021.   7. RTC in 12 months.     My collaborating MD Dr Raul Del.     For up to date information on the COVID-19 virus, visit the [facility] website. [email or web address]  ? General supportive care during cold and flu season and infection prevention reminders:   o Wash hands often with soap and water for at least 20 seconds  o Cover your mouth and nose  o Social distancing: try to maintain 6 feet between you and other people  o Stay home if sick and symptoms mild or manageable  - If you must be around people wear a mask    ? If you are having symptoms of a lower respiratory infection (cough, shortness of breath) and/or fever AND either traveled in last 30 days (internationally or to region of exposure) OR known exposure to patient with COVID19:    o Call your primary care provider for questions or health needs.   - Tell your doctor about your recent travel and your symptoms    o In a medical emergency, call 911 or go to the nearest emergency room.    Total Time Today was 30 minutes in the following activities: Preparing to see the patient, Obtaining and/or reviewing separately obtained history, Performing a medically appropriate examination and/or evaluation, Counseling and educating the patient/family/caregiver, Ordering medications, tests, or procedures and Documenting clinical information in the electronic or other health record      Myrla Halsted, APRN-NP

## 2020-12-20 ENCOUNTER — Encounter: Admit: 2020-12-20 | Discharge: 2020-12-20 | Payer: BC Managed Care – PPO

## 2020-12-23 ENCOUNTER — Encounter: Admit: 2020-12-23 | Discharge: 2020-12-23 | Payer: BC Managed Care – PPO

## 2020-12-23 MED ORDER — IPRATROPIUM BROMIDE 42 MCG (0.06 %) NA SPRY
Freq: Three times a day (TID) | 0 refills
Start: 2020-12-23 — End: ?

## 2021-05-26 ENCOUNTER — Encounter: Admit: 2021-05-26 | Discharge: 2021-05-26 | Payer: BC Managed Care – PPO

## 2021-06-22 ENCOUNTER — Ambulatory Visit: Admit: 2021-06-22 | Discharge: 2021-06-22 | Payer: BC Managed Care – PPO

## 2021-06-22 ENCOUNTER — Encounter: Admit: 2021-06-22 | Discharge: 2021-06-22 | Payer: BC Managed Care – PPO

## 2021-06-22 DIAGNOSIS — Z1231 Encounter for screening mammogram for malignant neoplasm of breast: Secondary | ICD-10-CM

## 2021-11-03 ENCOUNTER — Encounter: Admit: 2021-11-03 | Discharge: 2021-11-03 | Payer: BC Managed Care – PPO

## 2021-11-03 NOTE — Telephone Encounter
Left pt on 1/13 to inform pt of rescheduling due to provider being out. Pt is rescheduled and gave pt ooc number to call and get rescheduled if she is needing to

## 2021-11-28 ENCOUNTER — Encounter: Admit: 2021-11-28 | Discharge: 2021-11-28 | Payer: BC Managed Care – PPO

## 2021-11-28 DIAGNOSIS — G478 Other sleep disorders: Secondary | ICD-10-CM

## 2021-11-28 DIAGNOSIS — Z923 Personal history of irradiation: Secondary | ICD-10-CM

## 2021-11-28 DIAGNOSIS — C50411 Malignant neoplasm of upper-outer quadrant of right female breast: Secondary | ICD-10-CM

## 2021-11-28 DIAGNOSIS — R519 Generalized headaches: Secondary | ICD-10-CM

## 2021-11-28 DIAGNOSIS — R922 Inconclusive mammogram: Secondary | ICD-10-CM

## 2021-11-28 DIAGNOSIS — K219 Gastro-esophageal reflux disease without esophagitis: Secondary | ICD-10-CM

## 2021-11-28 DIAGNOSIS — K21 Bile reflux esophagitis: Secondary | ICD-10-CM

## 2021-11-28 DIAGNOSIS — K2210 Ulcer of esophagus without bleeding: Secondary | ICD-10-CM

## 2021-11-28 DIAGNOSIS — Z1231 Encounter for screening mammogram for malignant neoplasm of breast: Secondary | ICD-10-CM

## 2021-11-28 DIAGNOSIS — C50911 Malignant neoplasm of unspecified site of right female breast: Secondary | ICD-10-CM

## 2021-11-28 DIAGNOSIS — E162 Hypoglycemia, unspecified: Secondary | ICD-10-CM

## 2022-01-28 ENCOUNTER — Encounter: Admit: 2022-01-28 | Discharge: 2022-01-28 | Payer: BC Managed Care – PPO

## 2022-01-28 MED ORDER — TAMOXIFEN 20 MG PO TAB
20 mg | ORAL_TABLET | Freq: Every day | ORAL | 3 refills | Status: AC
Start: 2022-01-28 — End: ?

## 2022-06-05 ENCOUNTER — Encounter: Admit: 2022-06-05 | Discharge: 2022-06-05 | Payer: BC Managed Care – PPO

## 2022-06-21 ENCOUNTER — Encounter: Admit: 2022-06-21 | Discharge: 2022-06-21 | Payer: BC Managed Care – PPO

## 2022-06-26 ENCOUNTER — Encounter: Admit: 2022-06-26 | Discharge: 2022-06-26 | Payer: BC Managed Care – PPO

## 2022-06-26 ENCOUNTER — Ambulatory Visit: Admit: 2022-06-26 | Discharge: 2022-06-26 | Payer: BC Managed Care – PPO

## 2022-06-26 DIAGNOSIS — R922 Inconclusive mammogram: Secondary | ICD-10-CM

## 2022-06-26 DIAGNOSIS — K21 Bile reflux esophagitis: Secondary | ICD-10-CM

## 2022-06-26 DIAGNOSIS — Z1231 Encounter for screening mammogram for malignant neoplasm of breast: Secondary | ICD-10-CM

## 2022-06-26 DIAGNOSIS — G478 Other sleep disorders: Secondary | ICD-10-CM

## 2022-06-26 DIAGNOSIS — R519 Generalized headaches: Secondary | ICD-10-CM

## 2022-06-26 DIAGNOSIS — K219 Gastro-esophageal reflux disease without esophagitis: Secondary | ICD-10-CM

## 2022-06-26 DIAGNOSIS — E162 Hypoglycemia, unspecified: Secondary | ICD-10-CM

## 2022-06-26 DIAGNOSIS — K2210 Ulcer of esophagus without bleeding: Secondary | ICD-10-CM

## 2022-06-26 DIAGNOSIS — Z923 Personal history of irradiation: Secondary | ICD-10-CM

## 2022-06-26 DIAGNOSIS — C50911 Malignant neoplasm of unspecified site of right female breast: Secondary | ICD-10-CM

## 2022-07-11 ENCOUNTER — Encounter: Admit: 2022-07-11 | Discharge: 2022-07-11 | Payer: BC Managed Care – PPO

## 2022-07-17 ENCOUNTER — Ambulatory Visit: Admit: 2022-07-17 | Discharge: 2022-07-17 | Payer: BC Managed Care – PPO

## 2022-07-17 ENCOUNTER — Encounter: Admit: 2022-07-17 | Discharge: 2022-07-17 | Payer: BC Managed Care – PPO

## 2022-07-17 DIAGNOSIS — K219 Gastro-esophageal reflux disease without esophagitis: Secondary | ICD-10-CM

## 2022-07-17 DIAGNOSIS — G478 Other sleep disorders: Secondary | ICD-10-CM

## 2022-07-17 DIAGNOSIS — G43909 Migraine, unspecified, not intractable, without status migrainosus: Secondary | ICD-10-CM

## 2022-07-17 DIAGNOSIS — M1990 Unspecified osteoarthritis, unspecified site: Secondary | ICD-10-CM

## 2022-07-17 DIAGNOSIS — J302 Other seasonal allergic rhinitis: Secondary | ICD-10-CM

## 2022-07-17 DIAGNOSIS — Z923 Personal history of irradiation: Secondary | ICD-10-CM

## 2022-07-17 DIAGNOSIS — R519 Generalized headaches: Secondary | ICD-10-CM

## 2022-07-17 DIAGNOSIS — K2210 Ulcer of esophagus without bleeding: Secondary | ICD-10-CM

## 2022-07-17 DIAGNOSIS — J301 Allergic rhinitis due to pollen: Secondary | ICD-10-CM

## 2022-07-17 DIAGNOSIS — E162 Hypoglycemia, unspecified: Secondary | ICD-10-CM

## 2022-07-17 DIAGNOSIS — C50911 Malignant neoplasm of unspecified site of right female breast: Secondary | ICD-10-CM

## 2022-07-17 DIAGNOSIS — K21 Bile reflux esophagitis: Secondary | ICD-10-CM

## 2022-07-17 NOTE — Progress Notes
Date of Service: 07/17/2022    Victoria Mcknight is a 51 y.o. female.  DOB: [birth year]  MRN: [id number]     Subjective:          The patient presents with a history of allergies, sneezing, and itching, which have been persistent throughout the year. The patient was previously on allergy shots, which were reduced to once a month before the COVID-19 pandemic. The patient has been experiencing itchy eyes and sneezing, which have been severe enough to cause congestion in the head and make them feel fatigued. On two occasions during the fall, the patient felt so unwell that they thought they had contracted COVID-19, but the symptoms resolved the following day. The patient's itchy eyes have been particularly bothersome, and they have been using Zyrtec, eye drops, and Flonase to manage their symptoms.    The patient has also been dealing with a bulging disc in their back, which has been causing them discomfort. They have noticed that their nasal cavity appears mildly inflamed and has thick mucus, which they attribute to their underlying allergy issues. The patient has tried using phenylephrine for congestion relief but has found it to be ineffective.            Review of Systems   Constitutional: Negative.    HENT: Negative.    Eyes: Negative.    Respiratory: Negative.    Cardiovascular: Negative.    Gastrointestinal: Negative.    Endocrine: Negative.    Genitourinary: Negative.    Musculoskeletal: Negative.    Skin: Negative.    Allergic/Immunologic: Negative.    Neurological: Negative.    Hematological: Negative.    Psychiatric/Behavioral: Negative.        Objective:         ? azelastine(+) (ASTELIN) 137 mcg (0.1 %) nasal spray Apply two sprays to each nostril as directed twice daily. Use in each nostril as directed   ? butalbital-acetaminophen-caffeine (FIORICET) 50-325-40 mg tablet Take one tablet by mouth every 4 hours as needed for Headache.   ? calcium citrate (CALCITRATE) 950 mg tab Take one tablet by mouth daily.   ? EPINEPHrine (EPIPEN 2-PAK) 1 mg/mL injection pen (2-Pack) Inject 0.3 mg (1 Pen) into thigh if needed for anaphylactic reaction. May repeat in 5-15 minutes if needed.   ? esomeprazole DR(+) (NEXIUM) 40 mg capsule Take 20 mg by mouth daily. Take on an empty stomach at least 1 hour before or 2 hours after food.    ? fluticasone (FLONASE) 50 mcg/actuation nasal spray Apply  to each nostril as directed daily. Shake bottle gently before using.   ? gabapentin (NEURONTIN) 300 mg capsule TAKE 1 CAPSULE BY MOUTH ONCE DAILY AT BEDTIME TAKE 30 MINUTES PRIOR TO BEDTIME   ? Glucosamine-Chondroit-Vit C-Mn 500-400 mg cap Take 1 Cap by mouth daily.   ? ipratropium bromide (ATROVENT) 42 mcg (0.06 %) nasal spray USE 2 SPRAY(S) IN EACH NOSTRIL THREE TIMES DAILY AS DIRECTED   ? lactobacillus rhamnosus GG (CULTURELLE) 15 billion cell capsule Take one capsule by mouth as directed daily with breakfast.   ? mupirocin (BACTROBAN) 2 % topical ointment Apply  topically to affected area three times daily.   ? promethazine (PHENERGAN) 12.5 mg tablet Take one tablet by mouth every 6 hours as needed for Nausea.   ? tamoxifen (NOLVADEX) 20 mg tablet Take one tablet by mouth daily.   ? vitamins, multiple tablet Take one tablet by mouth daily.     Vitals:    07/17/22 0857  BP: 114/77   Pulse: 76   PainSc: Two   Weight: 56.2 kg (124 lb)   Height: 163.8 cm (5' 4.5)     Body mass index is 20.96 kg/m?[name]     Physical Exam    General   Well-developed, well-nourished   Communication and Voice:  Clear pitch and clarity, age appropriate   Head and Face   Inspection:  Normocephalic and atraumatic without masses or lesions  Eyes   Nystagmus: None   EOM: Equal extraocular motion bilaterally  ENT   External nose:  No scar or anatomic deformity   Internal Nose:  Septum intact and midline.  No edema, polyps, or rhinorrhea   Lips, Teeth, and gums:  Mucosa and teeth intact and viable   Oral cavity/oropharynx:  No erythema or exudate, no nodules, lesions or masses noted  Ear   External canal:  Left - Canal is patent with intact skin                             Right - Canal is patent with intact skin   Tympanic Membranes:  Left - Clear and mobile                                             Right - Clear and mobile   Middle Ears:      Left - appears aerated, no effusion, no masses                           Right - appears aerated, no effusion, no masses  Respiratory   Respiratory effort:  Equal inspiration & expiration without use of accessory muscles. No  stridor  Neuro/Psych/Balance   Orientation: Patient oriented to person, place, and time   Affect: Appropriate mood and affect    Assessment and Plan:            Allergic Rhinitis: Patient reports increased allergy symptoms, including sneezing, itching, and congestion. The patient had previously been on allergy shots but stopped during the COVID-19 pandemic. The plan is to refer the patient to the general allergy team for testing and restarting allergy injection immunotherapy. In the meantime, the patient will continue using Zyrtec and eye drops for symptom relief.    Nasal Congestion: Discussed the discontinuation of phenylephrine and recommended using Afrin decongestant spray for quick relief on particularly congested days, with caution not to use it for more than four or five days in a row.  Other oral decongestants result in tachycardia and  she does not tolerate this well.                   Total Time Today was 20 minutes in the following activities: Preparing to see the patient, Performing a medically appropriate examination and/or evaluation, Counseling and educating the patient/family/caregiver, Ordering medications, tests, or procedures and Documenting clinical information in the electronic or other health record

## 2022-08-08 ENCOUNTER — Encounter: Admit: 2022-08-08 | Discharge: 2022-08-08 | Payer: BC Managed Care – PPO

## 2022-10-18 ENCOUNTER — Encounter: Admit: 2022-10-18 | Discharge: 2022-10-18 | Payer: BC Managed Care – PPO

## 2022-10-18 ENCOUNTER — Ambulatory Visit: Admit: 2022-10-18 | Discharge: 2022-10-19 | Payer: BC Managed Care – PPO

## 2022-10-18 DIAGNOSIS — G478 Other sleep disorders: Secondary | ICD-10-CM

## 2022-10-18 DIAGNOSIS — C50911 Malignant neoplasm of unspecified site of right female breast: Secondary | ICD-10-CM

## 2022-10-18 DIAGNOSIS — Z923 Personal history of irradiation: Secondary | ICD-10-CM

## 2022-10-18 DIAGNOSIS — J309 Allergic rhinitis, unspecified: Secondary | ICD-10-CM

## 2022-10-18 DIAGNOSIS — M1990 Unspecified osteoarthritis, unspecified site: Secondary | ICD-10-CM

## 2022-10-18 DIAGNOSIS — K2210 Ulcer of esophagus without bleeding: Secondary | ICD-10-CM

## 2022-10-18 DIAGNOSIS — G43909 Migraine, unspecified, not intractable, without status migrainosus: Secondary | ICD-10-CM

## 2022-10-18 DIAGNOSIS — J329 Chronic sinusitis, unspecified: Secondary | ICD-10-CM

## 2022-10-18 DIAGNOSIS — E162 Hypoglycemia, unspecified: Secondary | ICD-10-CM

## 2022-10-18 DIAGNOSIS — L989 Disorder of the skin and subcutaneous tissue, unspecified: Secondary | ICD-10-CM

## 2022-10-18 DIAGNOSIS — J301 Allergic rhinitis due to pollen: Secondary | ICD-10-CM

## 2022-10-18 DIAGNOSIS — K219 Gastro-esophageal reflux disease without esophagitis: Secondary | ICD-10-CM

## 2022-10-18 DIAGNOSIS — H1010 Acute atopic conjunctivitis, unspecified eye: Secondary | ICD-10-CM

## 2022-10-18 DIAGNOSIS — J302 Other seasonal allergic rhinitis: Secondary | ICD-10-CM

## 2022-10-18 DIAGNOSIS — R519 Generalized headaches: Secondary | ICD-10-CM

## 2022-10-18 DIAGNOSIS — L509 Urticaria, unspecified: Secondary | ICD-10-CM

## 2022-10-18 DIAGNOSIS — K21 Bile reflux esophagitis: Secondary | ICD-10-CM

## 2022-10-18 MED ORDER — RYALTRIS 665-25 MCG/SPRAY NA SPRY
2 | Freq: Two times a day (BID) | NASAL | 3 refills | Status: AC
Start: 2022-10-18 — End: ?

## 2022-10-18 MED ORDER — PATADAY ONCE DAILY RELIEF 0.7 % OP DROP
1 [drp] | Freq: Every day | OPHTHALMIC | 0 refills | 27.50000 days | Status: AC
Start: 2022-10-18 — End: ?

## 2022-10-18 NOTE — Patient Instructions
Patient Instruction Sheet for Allergy Skin Testing    Skin Test: Skin tests are methods of testing for allergic antibodies. A test consists of introducing small amounts of the suspected substance, or allergen, into the skin and noting the development of a positive reaction (which consists of a welt or hive with surrounding redness). The results are read at 15 to 20 minutes after the application of the allergen. The skin test methods are:    Prick Method: The skin is pricked with a very short needle coated with a drop of allergen (ex ragweed pollen).    Intradermal Method: This method consists of injecting small amounts of an allergen into the superficial layers of the skin.    Interpreting the clinical significance of skin tests requires skillful correlation of the test results with the patient?s clinical history. Positive tests indicate the presence of allergic antibodies and are not necessarily correlated with clinical symptoms.    You may be tested to important (location) airborne allergens and possibly some foods. These may include trees, grasses, weeds, molds, dust mites, animal danders, and some foods.  The skin testing generally takes 45 minutes. Prick (also known as percutaneous) tests are usually performed on your back but may also be performed on your arms. Intradermal skin tests may be performed if the prick skin tests are negative and are performed on your arms. If  you have a specific allergic sensitivity to one of the allergens, a red, raised, itchy bump (caused by histamine release into the skin) will appear on your skin within 15 to 20 minutes. These positive reactions will gradually disappear over a period of 30 to 60 minutes and, typically, no treatment is necessary for this itchiness. Occasionally, local swelling at a test site will begin 4 to 8 hours after the skin tests are applied, particularly at sites of intradermal testing. These reactions are not serious, do not indicate a meaningful allergy, and will disappear over the next couple days or so. They should be reported to your physician at your next visit. You may be scheduled for skin testing to antibiotics, local anesthetics, venoms, or other biological agents. The same guidelines apply.      If you have any questions whether or not you are using an antihistamine, you may ask the nurse or the doctor. In some instances a longer period of time off these medications may be necessary.  If your condition requires continuous administration of any of the above medications, or if you have a question about a certain medication, please notify us so we may discuss this with you and determine whether alternative tests are indicated.     Skin testing will be administered at this medical facility with a medical physician present since occasional reactions may require immediate therapy. These reactions may consist of any or all of the following symptoms: itchy eyes, nose, or throat; nasal congestion; runny nose; tightness in the throat or chest; increased wheezing; lightheadedness; faintness; nausea and vomiting; hives; generalized itching; and shock, the latter under extreme circumstances.     Please let the physician and nurse know if you are:  Pregnant as allergy testing may be post-poned until after pregnancy.  Taking beta blockers, medications that may make the treatment of the reaction to skin testing more difficult.    Please note that these reactions rarely occur but in the event a reaction would occur, the  staff is fully trained and emergency equipment is available.  After skin testing, you will consult with your  physician, who will make further recommendations regarding your treatment    Please do not cancel your appointment since the time set aside for your skin test is exclusively yours. If for any reason you need to change your skin test appointment, please give [place] at least 48 hours notice, due to the length of time scheduled for skin testing, a last minute change results in a loss of valuable time that another patient might have utilized.    SKIN TESTING DRUG GUIDELINES    DIAGNOSTIC SKIN TESTS CANNOT BE DONE IF PATIENTS ARE  TAKING THE FOLLOWING MEDICATIONS    You must consult with referring physician or allergist prior to stopping any medications    Medications to Withhold Length of Time to [facility]   Topical anti-inflammatory or steroid (creams, lotions, gels, ointments, or solutions)   Do not apply the morning of test   Long-lasting antihistamines   Cetirizine (Zyrtec), fexofenadine (Allegra), desloratadine (Clarinex), levocetirizine (Xyzal), loratadine (Claritin), hydroxyzine (Atarax/Vistaril), cyproheptadine (Periactin), pyrilamine (Midol)    Withhold 7 days prior to testing   Other antihistamines  Actifed (chlorpheneramine), Dimetapp (brompheniramine), Benadryl (diphenhydramine), and Tavist (clemastine)   Withhold 5 days prior to skin testing   Nasal and eye medications  Patanase/Pataday/Patanol (olopatadine), Astepro/Optivar/Astelin/Dymista (azelastine)   Withhold 2 days prior to skin testing   Biologics  Xolair (omalizumab)   Withhold for 6 months prior to skin testing   Miscellaneous  Zantac (ranitidine), Pepcid (famotidine), and Tagamet (cimetidine)   Withhold for 1 day prior to skin testing   Psychiatric Medications  Amitriptyline (Elavil), clomipramine (Anafranil), desipramine (Norpramin), doxepin (Sinequan), imipramine (Tofranil), nortriptyline (Pamelor), protriptyline (Vivactil), and trimipramine (Surmontil) have antihistaminic activity.     Withhold for 2 weeks prior to skin testing   Nausea Medications  Promethazine  Dramamine (dimenhydrinate)   Withhold for 5 days prior to skin testing   Sleep aids  Trazodone, Doxylamine    Withhold for 3 days prior to skin testing   Muscle Relaxants  Flexeril (cyclobenzaprine)   Withhold 2 weeks prior to skin testing     If there are any questions whether or not you are using an antihistamine, you may ask your referring doctor or one of our allergists. In some instances a longer period of time off these medications may be necessary.  If the condition requires continuous administration of any of the above medications, or if you have a question about a certain medication, please notify us so we may discuss this and determine whether alternative tests are indicated.     THE FOLLOWING MEDICATIONS CAN BE TAKEN PRIOR TO SKIN TESTING  Nasal Steroids  Flonase/Veramyst (fluticasone), Rhinocort (budesonide), Nasonex (mometasone), Nasacort (triamcinolone), Omnaris/Zetonna (ciclesonide), QNASL (beclomethasone), and Nasarel (flunisolide) Asthma Inhalers  Inhaled steroids  Albuterol/Atrovent rescue inhalers  Albuterol/Atrovent nebulizers   Leukotriene Receptor Antagonists  Singulair (montelukast)  Accolate (zafirlukast)  Zyflo (zileuton)  *Should be avoided prior to challenges Theophylline  Theo-Dur,T-Phyl, Uniphyl, Theo-24    Prednisone  *Should be avoided prior to challenges

## 2022-10-19 DIAGNOSIS — J301 Allergic rhinitis due to pollen: Secondary | ICD-10-CM

## 2022-11-13 ENCOUNTER — Encounter: Admit: 2022-11-13 | Discharge: 2022-11-13 | Payer: BC Managed Care – PPO

## 2022-12-04 ENCOUNTER — Encounter: Admit: 2022-12-04 | Discharge: 2022-12-04 | Payer: BC Managed Care – PPO

## 2022-12-04 DIAGNOSIS — K2210 Ulcer of esophagus without bleeding: Secondary | ICD-10-CM

## 2022-12-04 DIAGNOSIS — J301 Allergic rhinitis due to pollen: Secondary | ICD-10-CM

## 2022-12-04 DIAGNOSIS — K219 Gastro-esophageal reflux disease without esophagitis: Secondary | ICD-10-CM

## 2022-12-04 DIAGNOSIS — G478 Other sleep disorders: Secondary | ICD-10-CM

## 2022-12-04 DIAGNOSIS — L989 Disorder of the skin and subcutaneous tissue, unspecified: Secondary | ICD-10-CM

## 2022-12-04 DIAGNOSIS — J329 Chronic sinusitis, unspecified: Secondary | ICD-10-CM

## 2022-12-04 DIAGNOSIS — R519 Generalized headaches: Secondary | ICD-10-CM

## 2022-12-04 DIAGNOSIS — H1010 Acute atopic conjunctivitis, unspecified eye: Secondary | ICD-10-CM

## 2022-12-04 DIAGNOSIS — K21 Bile reflux esophagitis: Secondary | ICD-10-CM

## 2022-12-04 DIAGNOSIS — M1990 Unspecified osteoarthritis, unspecified site: Secondary | ICD-10-CM

## 2022-12-04 DIAGNOSIS — E162 Hypoglycemia, unspecified: Secondary | ICD-10-CM

## 2022-12-04 DIAGNOSIS — J309 Allergic rhinitis, unspecified: Secondary | ICD-10-CM

## 2022-12-04 DIAGNOSIS — C50911 Malignant neoplasm of unspecified site of right female breast: Secondary | ICD-10-CM

## 2022-12-04 DIAGNOSIS — C50411 Malignant neoplasm of upper-outer quadrant of right female breast: Secondary | ICD-10-CM

## 2022-12-04 DIAGNOSIS — M549 Dorsalgia, unspecified: Secondary | ICD-10-CM

## 2022-12-04 DIAGNOSIS — F419 Anxiety disorder, unspecified: Secondary | ICD-10-CM

## 2022-12-04 DIAGNOSIS — Z923 Personal history of irradiation: Secondary | ICD-10-CM

## 2022-12-04 DIAGNOSIS — J302 Other seasonal allergic rhinitis: Secondary | ICD-10-CM

## 2022-12-04 DIAGNOSIS — T7840XA Allergy, unspecified, initial encounter: Secondary | ICD-10-CM

## 2022-12-04 DIAGNOSIS — L509 Urticaria, unspecified: Secondary | ICD-10-CM

## 2022-12-04 DIAGNOSIS — G43909 Migraine, unspecified, not intractable, without status migrainosus: Secondary | ICD-10-CM

## 2022-12-09 ENCOUNTER — Encounter: Admit: 2022-12-09 | Discharge: 2022-12-09 | Payer: BC Managed Care – PPO

## 2023-01-17 ENCOUNTER — Encounter: Admit: 2023-01-17 | Discharge: 2023-01-17 | Payer: BC Managed Care – PPO

## 2023-01-22 ENCOUNTER — Encounter: Admit: 2023-01-22 | Discharge: 2023-01-22 | Payer: BC Managed Care – PPO

## 2023-01-22 ENCOUNTER — Ambulatory Visit: Admit: 2023-01-22 | Discharge: 2023-01-23 | Payer: BC Managed Care – PPO

## 2023-01-22 DIAGNOSIS — K2210 Ulcer of esophagus without bleeding: Secondary | ICD-10-CM

## 2023-01-22 DIAGNOSIS — F419 Anxiety disorder, unspecified: Secondary | ICD-10-CM

## 2023-01-22 DIAGNOSIS — L989 Disorder of the skin and subcutaneous tissue, unspecified: Secondary | ICD-10-CM

## 2023-01-22 DIAGNOSIS — L509 Urticaria, unspecified: Secondary | ICD-10-CM

## 2023-01-22 DIAGNOSIS — G478 Other sleep disorders: Secondary | ICD-10-CM

## 2023-01-22 DIAGNOSIS — J301 Allergic rhinitis due to pollen: Secondary | ICD-10-CM

## 2023-01-22 DIAGNOSIS — M549 Dorsalgia, unspecified: Secondary | ICD-10-CM

## 2023-01-22 DIAGNOSIS — G43909 Migraine, unspecified, not intractable, without status migrainosus: Secondary | ICD-10-CM

## 2023-01-22 DIAGNOSIS — H1010 Acute atopic conjunctivitis, unspecified eye: Secondary | ICD-10-CM

## 2023-01-22 DIAGNOSIS — J329 Chronic sinusitis, unspecified: Secondary | ICD-10-CM

## 2023-01-22 DIAGNOSIS — E162 Hypoglycemia, unspecified: Secondary | ICD-10-CM

## 2023-01-22 DIAGNOSIS — J302 Other seasonal allergic rhinitis: Secondary | ICD-10-CM

## 2023-01-22 DIAGNOSIS — J309 Allergic rhinitis, unspecified: Secondary | ICD-10-CM

## 2023-01-22 DIAGNOSIS — H1013 Acute atopic conjunctivitis, bilateral: Secondary | ICD-10-CM

## 2023-01-22 DIAGNOSIS — R519 Generalized headaches: Secondary | ICD-10-CM

## 2023-01-22 DIAGNOSIS — K219 Gastro-esophageal reflux disease without esophagitis: Secondary | ICD-10-CM

## 2023-01-22 DIAGNOSIS — Z923 Personal history of irradiation: Secondary | ICD-10-CM

## 2023-01-22 DIAGNOSIS — M1990 Unspecified osteoarthritis, unspecified site: Secondary | ICD-10-CM

## 2023-01-22 DIAGNOSIS — T7840XA Allergy, unspecified, initial encounter: Secondary | ICD-10-CM

## 2023-01-22 DIAGNOSIS — C50911 Malignant neoplasm of unspecified site of right female breast: Secondary | ICD-10-CM

## 2023-01-22 DIAGNOSIS — K21 Bile reflux esophagitis: Secondary | ICD-10-CM

## 2023-01-22 MED ORDER — BETAMETHASONE DIPROPIONATE 0.05 % TP CREA
15 g | Freq: Two times a day (BID) | TOPICAL | 0 refills | 30.00000 days | Status: DC
Start: 2023-01-22 — End: 2023-01-22

## 2023-01-22 MED ORDER — AZELASTINE-FLUTICASONE 137-50 MCG/SPRAY NA SPRY
1 | Freq: Two times a day (BID) | NASAL | 3 refills | Status: AC
Start: 2023-01-22 — End: ?

## 2023-01-22 MED ORDER — MONTELUKAST 10 MG PO TAB
10 mg | ORAL_TABLET | Freq: Every evening | ORAL | 3 refills | 90.00000 days | Status: AC
Start: 2023-01-22 — End: ?

## 2023-01-22 MED ORDER — EPINEPHRINE 0.3 MG/0.3 ML IJ ATIN
INTRAMUSCULAR | 1 refills | 30.00000 days | Status: AC
Start: 2023-01-22 — End: ?

## 2023-01-22 MED ORDER — BETAMETHASONE DIPROPIONATE 0.05 % TP CREA
1 g | Freq: Two times a day (BID) | TOPICAL | 0 refills | 30.00000 days | Status: AC
Start: 2023-01-22 — End: ?

## 2023-01-22 NOTE — Patient Instructions
Plan:   - Start Dymista 1 spray each nostril twice daily.   - Stop Flonase and azelastine once on Dymista.  - Start montelukast (Singulair) 10 mg before bed.   - Continue fexofenadine 180 mg daily.     AEROALLERGEN AVOIDANCE INSTRUCTIONS    MOLD  Indoors, mold season is year round.  Outdoors, most mold prefer seasons with high humidity.  Mold prefers damp, dark, warm places.  Here are some tips on how to avoid mold exposure.  Keep humidity inside between 35-50% with air conditioning or dehumidifier.  The humidity level can be checked with a meter from a hardware store.    Clean surfaces where mold grows and dry wet areas.  Avoid steam cleaning carpets and discard moldy belongings.  Wear a mask when doing yard work and refrain from walking through uncut fields or playing in leaves.  Minimize use of potted plants and do not keep them indoors.  Consider an allergy cover for the pillow and mattress.    POLLEN  Pollens are the tiny airborne particles given off by trees, weeds, and grasses.  They can be the cause of seasonal allergic rhinitis or hay fever symptoms, which include stuffy, itchy, runny nose, redness, swelling and itching of the eyes, and itching of the ears and throat.  Here are some tips on how to avoid pollen exposure.  Keep windows closed and use the air conditioner when possible.  Avoid outside exposure in the early morning as pollen counts are highest at that time.  Take a shower and wash hair each night.  Consider wearing a mask when working in the yard and/or garden.  Clean furnace filter monthly with HEPA filters.  Consider a HEPA filter vacuum cleaner which will prevent pollen from being reintroduced into the air.      DUST MITES  Dust mites can never be entirely eliminated in the house no matter how clean your house is.  Dust mites are attracted to warm, moist areas and feed on dead skin flakes.  Here are tips to minimize dust mites in your home.  Encase pillows and mattress/box springs in zippered allergy covers.  Wash bedding in hot water (at least 130?F) every 7-14 days.  Avoid curtains, carpet, and upholstered furniture.  If they cannot be removed, treat every 3 months with tannic acid.  Use HEPA air filters and a HEPA filter vacuum cleaner.  Change filters monthly.  Keep bedroom simple, avoiding clutter, so it can quickly be dusted.  Cover heating vents with vent filters.  Keep stuffed toys in a closed container and wash or freeze regularly.  Keep clothing in the closet with the door closed.    PETS  Pets present many problems for people with allergies.  Dander from pets is very difficult to remove and also is a food source for dust mites.  If possible, find the pet a new home.  If not possible, keep the pet outdoors.  Never allow the pet into the bedroom.  Wash pet weekly in warm water.  Encase mattresses, pillows, and box springs in allergen-proof covers.  Use HEPA air filters and a HEPA filter vacuum cleaner.  Change filters monthly.    COCKROACH  Cockroaches are one of the leading causes of asthma attacks.  Here are tips for eradication and prevention.  Exterminate entire home, then thoroughly clean including vacuuming.  Use roach traps and seal all cracks in the home.  Keep food in airtight containers and clean kitchen after every meal.  Do not store paper bags, newspapers, or cardboard boxes.       ALLERGY SHOTS    Allergy injections are intended to modify the sensitivity of the patient so that exposure to offending allergens (pollens, dust, mold, etc.) will result in fewer symptoms.  Allergen injections lead to the formation of blocking antibody and a gradual decrease of the allergy antibody level.  These changes allow the patient to withstand exposure to the allergens with less reaction.  This is the development of tolerance, which lasts for different lengths of time for different patients.    Injections are started at small doses and with each injection the dose is increased until the patient reaches a maintenance dose that has been predetermined from experience with other patients.  Reactions for the injection may include local redness, itching, and swelling.  Rarely, a patient will experience the onset of generalized symptoms.  Generalized symptoms may be severe and are usually seen in the first 30 minutes following the injection.  Therefore, patients are required to wait 30 minutes following an injection before leaving the office.  Rarely, a patient might note a pattern of increased symptoms occurring several hours after injections.  All reactions must be reported to the nurse before the next injection is given.  Severe reactions indicate that the allergen dose must be decreased.  If your child suffers a serious reaction after leaving the office, call and return immediately for treatment!    Shots are given once or twice weekly while the allergen dose is being increased.  Vials are given in order of increasing concentrations:  Silver (weakest - used in some patients); green; blue; yellow; and red (strongest).  After the maintenance dose has been achieved, the time interval between injections is gradually increased to 2 to 4 weeks.    Improvement should not be anticipated immediately!  Occasionally, relief of symptoms is realized after only a few months, but more frequently one sees a trend of improvement over a 6-12 month period.    The length of time a patient continues injections depends on the response to the treatment.  Ideally, the patient should be free of symptoms without medicine for about one year.  The average duration of treatment has been suggested as three years, but in some instances, treatment is required beyond this time.  Duration and changes in therapy must be specifically tailored for each patient.  This points out the importance of regular follow-up evaluations.    Do not withhold medications on the day allergy shots are received.  If you are having significant symptoms of allergy, asthma or infection, injections are usually withheld for one week.    Allergy shots should always be given in a medical facility by trained personnel.  Never give your injections at home.  If you have questions about your treatment that cannot be satisfactorily answered by the injection personnel, call your allergist.    Because we prepare allergy extract for each individual patient, we must charge for them when they are are made.  Therefore, the patient may be responsible for any charges not covered by the insurance.  We advise all patients to contact their insurance to verify coverage for allergy immunotherapy prior to the initiation.  Due to unforseen reactions or interruptions in the injection schedule certain vials may expire and may need to be remade causing additional charges.  We want to ensure patients are aware of all the costs and will make every effort to accommodate your individual needs.  Billing for allergy vials CPT code 16109: This is only billed when vials are made.  This is billed one time for the number of doses that are contained in a vial (multiplied by the total number of vials).    This code would not be billed again until a new vial is prepared, which occurs approximately annually.  More than one vial can be prepared (of different substances) and billed on a single date of service.      Allergy shot administration billed at every shot visit:  CPT code 60454 is billed if the patient receives one injection during the visit  CPT code 09811 is billed if the patient receives 2 or more injections during the visit  These are only nurse visits    Allergy vials and injections may be subject to deductible or coinsurance and may be different for each insurance plan, so we do recommend contacting your insurance company prior to starting allergy shots.  Although they may be costly, it should be weighed against the cost of prescription copays and quality of life on prescriptions versus injections.

## 2023-01-22 NOTE — Progress Notes
Date of Service: 01/22/2023    Subjective:             Victoria Mcknight is a 52 y.o. female who is seen in follow-up regarding allergic rhinitis.     History of Present Illness  Victoria Mcknight is seen in follow-up. PMH includes Allergic Rhinitis, Breast Cancer s/p radiation therapy. Patient's PCP is Dr. Kathi Ludwig at [facility]     Patient noticed flare of pruritus and nasal symptoms since stopping cetirizine in preparation for the test. She has been managing pruritus with topical betamethasone 0.05%, which was prescribed by an online provider.     Current regimen includes Flonase 2 SPEN daily, azelastine 2 SPEN BID, cetirizine 10 mg. She uses ipratropium prn, typically prior to teaching. She will develop coughing fits about once per month, often at work, and use of ipratropium seems to help. She feels her allergy symptoms could be better controlled. Ryaltris was too expensive.      Consult note 10/18/22:   Of note, patient follows with Dr. Jeanella Craze with ENT and had previously been on injection immunotherapy from 2018-2020, which was discontinued at the onset of the COVID pandemic.  She notes that prior to initiation of injection immunotherapy she underwent allergy testing for which she was found to be allergic to trees, weeds, mold, dust mites, cats, and cockroaches.  She notes that while on immunotherapy treatment she had significant improvement in her allergic rhinitis symptoms and notes that she is interested in resuming therapy at this time.     She states that since discontinuation of the immunotherapy she has had recurrence of her symptoms which includes significant bouts of sneezing and sinus congestion as well as severely pruritic eyes with watering.  She denies any significant cough or respiratory symptoms outside of when she believes she has contracted a cold.  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  She otherwise reports experiencing symptoms both at home as well as at work, and denies any obvious seasonal component as they are present year-round.  She does specifically note a few instances of flairs towards the end of November after a significant amount of rain where she developed a transient 24-hour episode of worsening rhinorrhea as well as cough and congestion that she was concerned was COVID.  She reports that the following day prior to testing for COVID her symptoms had spontaneously resolved, which led her to believe that this was all a flare of her allergies.     At this time, she reports that she is taking Flonase nasal spray as well as Zyrtec, however she is unclear of the benefit of these medications that she continues to have significant symptoms.  She also reports that she has been prescribed Ketoifen fumarate eyedrops, which has been using intermittently when she has increased watering or redness in her eyes with some benefit.      She denies any history of adverse reactions while on the immunotherapy injections historically.             Review of Systems      Objective:          azelastine(+) (ASTELIN) 137 mcg (0.1 %) nasal spray Apply two sprays to each nostril as directed twice daily. Use  in each nostril as directed    butalbital-acetaminophen-caffeine (FIORICET) 50-325-40 mg tablet Take one tablet by mouth every 4 hours as needed for Headache.    calcium citrate (CALCITRATE) 950 mg tab Take one tablet by mouth daily.    EPINEPHrine (EPIPEN 2-PAK) 1 mg/mL injection pen (2-Pack) Inject 0.3 mg (1 Pen) into thigh if needed for anaphylactic reaction. May repeat in 5-15 minutes if needed.    esomeprazole DR(+) (NEXIUM) 40 mg capsule Take 20 mg by mouth daily. Take on an empty stomach at least 1 hour before or 2 hours after food.     fluticasone (FLONASE) 50 mcg/actuation nasal spray Apply  to each nostril as directed daily. Shake bottle gently before using.    gabapentin (NEURONTIN) 300 mg capsule TAKE 1 CAPSULE BY MOUTH ONCE DAILY AT BEDTIME TAKE 30 MINUTES PRIOR TO BEDTIME    Glucosamine-Chondroit-Vit C-Mn 500-400 mg cap Take 1 Cap by mouth daily.    ipratropium bromide (ATROVENT) 42 mcg (0.06 %) nasal spray USE 2 SPRAY(S) IN EACH NOSTRIL THREE TIMES DAILY AS DIRECTED    lactobacillus rhamnosus GG (CULTURELLE) 15 billion cell capsule Take one capsule by mouth as directed daily with breakfast.    mupirocin (BACTROBAN) 2 % topical ointment Apply  topically to affected area three times daily.    olopatadine (PATADAY ONCE DAILY RELIEF) 0.7 % ophthalmic drop Apply one drop to both eyes daily.    olopatadine-mometasone (RYALTRIS) [id number] mcg/spray nasal spray Apply two sprays to each nostril as directed twice daily.    pantoprazole DR (PROTONIX) 40 mg tablet Take one tablet by mouth daily.    promethazine (PHENERGAN) 12.5 mg tablet Take one tablet by mouth every 6 hours as needed for Nausea.    sertraline (ZOLOFT) 25 mg tablet Take one tablet by mouth daily.    tamoxifen (NOLVADEX) 20 mg tablet Take one tablet by mouth daily.    vitamins, multiple tablet Take one tablet by mouth daily.     Vitals:    01/22/23 0809   BP: 116/78   Pulse: 81   Temp: 36.4 ?C (97.5 ?F)   PainSc: Zero   Weight: 54 kg (119 lb)   Height: 163.8 cm (5' 4.5)     Body mass index is 20.11 kg/m?[name]     Physical Exam  Constitutional:       General: She is not in acute distress.  HENT:      Right Ear: Tympanic membrane, ear canal and external ear normal.      Left Ear: Tympanic membrane, ear canal and external ear normal.      Nose: Nose normal. No congestion or rhinorrhea.      Mouth/Throat:      Mouth: Mucous membranes are moist.      Pharynx: Oropharynx is clear. No posterior oropharyngeal erythema.   Eyes:      Conjunctiva/sclera: Conjunctivae normal.      Pupils: Pupils are equal, round, and reactive to light.   Cardiovascular:      Rate and Rhythm: Normal rate and regular rhythm.      Heart sounds: Normal heart sounds. No murmur heard.  Pulmonary:      Effort: Pulmonary effort is normal. No respiratory distress.      Breath sounds: No stridor. No wheezing.   Lymphadenopathy:      Cervical: No cervical adenopathy.   Skin:     General: Skin is warm and dry.   Neurological:      General: No focal deficit present.  Mental Status: She is alert and oriented to person, place, and time.   Psychiatric:         Mood and Affect: Mood normal.         Behavior: Behavior normal.              Testing Information  Consent Obtained: Yes  Time Antigen Placed: 1610  Needle Type: Multi-Test II Device  Test Location: Back  Antigen Manufacturer: [employer]  Testing Nurse: Willia Craze ll  Reviewing Physician: Dr. Greer Pickerel, MD  Time Test Read: [phone]    Controls  1. Dil w/50% Glycerin (HS): 0/0  2. Histamine dihydrochloride: 8/18    Trees  Ash, White 1:20 (HS): 4/4  Birch Mix 1:20 (HS): 0/0  Box Elder 1:20 (HS): 4/4  Cedar, Mountain 1:20 (HS): 4/4  Cottonwood 1:20 (HS): 0/0  Elm, Chinese 1:20 (HS): 0/0  Maple Mix 1:20 (HS): 3/3  Mulberry Mix 1:20 (HS): 4/4  Oak Mix 1:20 (HS): 0/0  Pine Mix 1:20 (HS): 3/3  Sweetgum 1:20 (HS): 4/4  Sycamore, American 1:20 (HS): 0/0  Walnut, Black 1:20 (HS): 5/5    Grasses  Bahia 1:20 HS: 0/0  [place] 10,000 BAU/ml (HS): 0/0  Johnson 1:20 (HS): 3/3  Sweet Vernal 100,000 BAU/ml (HS): 4/4  Timothy 100,000 BAU/ml (HS): 3/3     Weeds  Careless/Pigweed Mix 1:20 (HS): 3/3  Cocklebur 1:20 (HS): 0/0  Dock/Sorrel Mix 1:20 (HS): 0/0  Kochia 1:20 (HS): 3/3  Marsh Elder 1:20 (HS): 5/5  Nettle 1:20 (HS): 3/3  Plantain, English 1:20 (HS): 3/3  Ragweed, Short 1:20 (HS): 3/3  Ragweed, Western 1:20 (HS): 3/3  [place] Thistle 1:20 (HS): 5/5  Sagebrush/Mugwort 1:20 (HS): 0/0    Mites and Cockroaches  Cockroach, American 1:10 (HS): 4/4  Cockroach, German 1:10 (HS): 0/0  Derm. Farinae 30,000 AU/ml (HS): 3/3  Derm. Pteronyssinus 30,000 AU/ml (HS): 3/3    Animals  Cat, AP Pelt 10,000 BAU/ml (HS): 3/3  Dog, AP Hair and Dander 1:10 (HS): 0/0  Feather Mix 1:10 (HS): 0/0  Mouse Epithelium 1:20 (Alk): 0/0    Molds and Fungi  Alternaria Tenuis 1:10 (HS): 0/0  Aspergillus Fumigatus 1:10 (HS): 6/6  Boytritis 1:10 HS: 0/0  Epicoccum Nigrum 1:10 (HS): 3/3  Fusarium Vasinfectum 1:10 (HS): 0/0  Helminthosporium Inters. 1:10 (HS): 0/0  Horm. Cladosporioides 1:10 (HS): 3/3  Mucor Racemosus 1:10 (HS): 0/0  Penicillium [place] 1:10 (HS): 0/0  Stemphylium Botryosum 1:10 (HS): Stemph Bot Not Tested. PHOMA HERBARUM 1:10 (HS) 0/0  Histamine Phosphate 10mg /ml (HS): 10/25    Skin test is positive to multiple tree pollens, grass pollens, weed pollens, dust mite, American cockroach, cat and several molds.     Assessment and Plan:  #1 Allergic Rhinitis  #2 Allergic conjunctivitis   #3 History breast cancer s/p radiation     Skin test today positive as outlined above. Aeroallergen avoidance measures recommended and discussed.     Symptoms are year-round and not well-controlled with Flonase 2 SPEN daily, azelastine 2 SPEN BID, cetirizine 10 mg daily. Patient is interested in allergy immunotherapy, as she has previously found this helpful.     The potential risks and benefits of subcutaneous allergy immunotherapy have been discussed which include but are not limited to possibility of an allergic reaction which can include urticaria, angioedema, respiratory symptoms and a life threatening allergic reaction, along with alternative therapies, and continued avoidance. The need for EpiPen prescription while on allergy SCIT has been discussed. Any questions have been answered. The  patient would like to proceed with allergy SCIT.  She would like to receive shots at her PCP's office. PCP is Dr. Kathi Ludwig at [facility].     We discussed and agreed upon the following plan:    - Plan to start allergy shots. EpiPen refill provided. Discussed need to bring EpiPen twin-pack to all shot appointments.    - Start Dymista 1 SPEN BID. Rx provided.   - Stop Flonase and azelastine once you start Dymista.   - Start Singulair 10 mg qhs. Risks discussed, including boxed warning. Rx provided.   - Continue cetirizine 10 mg daily     RTC 4 months.    Evonnie Dawes, M.D.

## 2023-01-24 ENCOUNTER — Encounter: Admit: 2023-01-24 | Discharge: 2023-01-24 | Payer: BC Managed Care – PPO

## 2023-02-03 ENCOUNTER — Encounter: Admit: 2023-02-03 | Discharge: 2023-02-03 | Payer: BC Managed Care – PPO

## 2023-02-03 MED ORDER — TAMOXIFEN 20 MG PO TAB
20 mg | ORAL_TABLET | Freq: Every day | ORAL | 0 refills
Start: 2023-02-03 — End: ?

## 2023-02-22 IMAGING — CR [ID]
4 series · 4 of 4 positions shown · non-contrast
Comparison: none

[ribs chest pa]
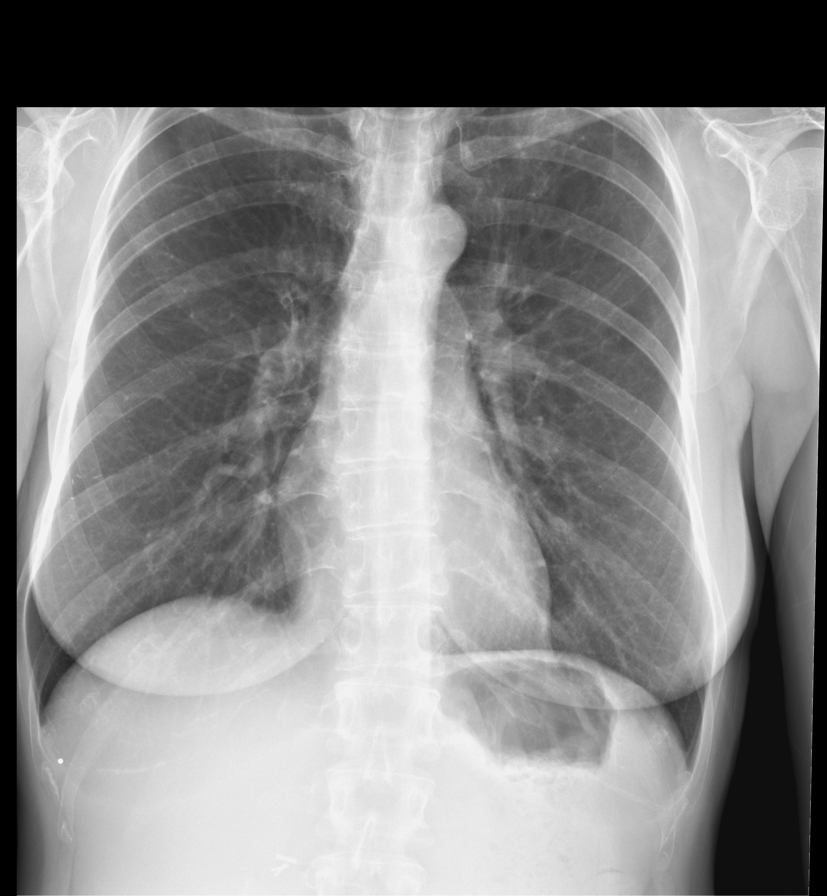

[ribs ap upper]
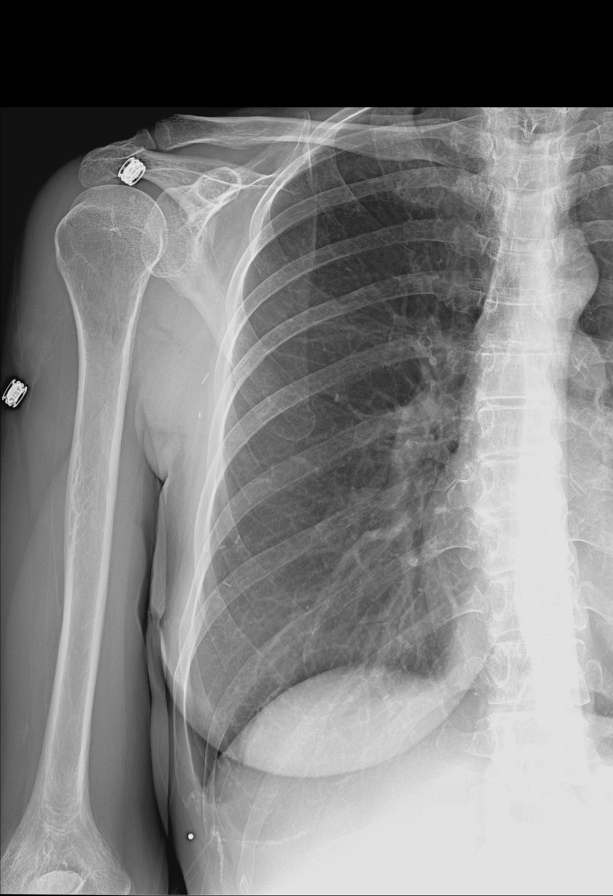

[ribs obl]
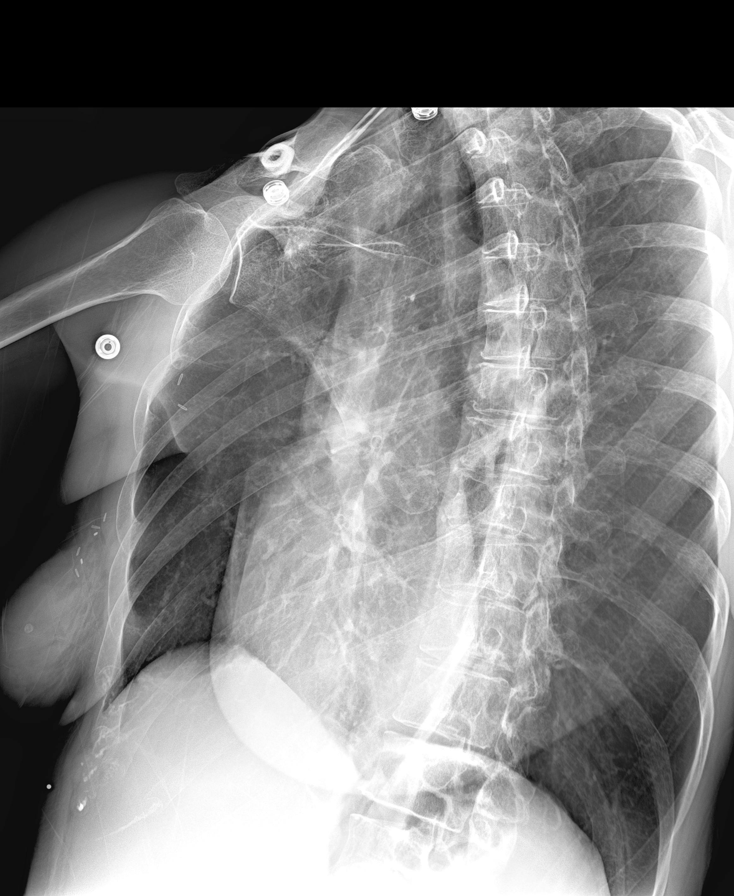

[ribs ap lower]
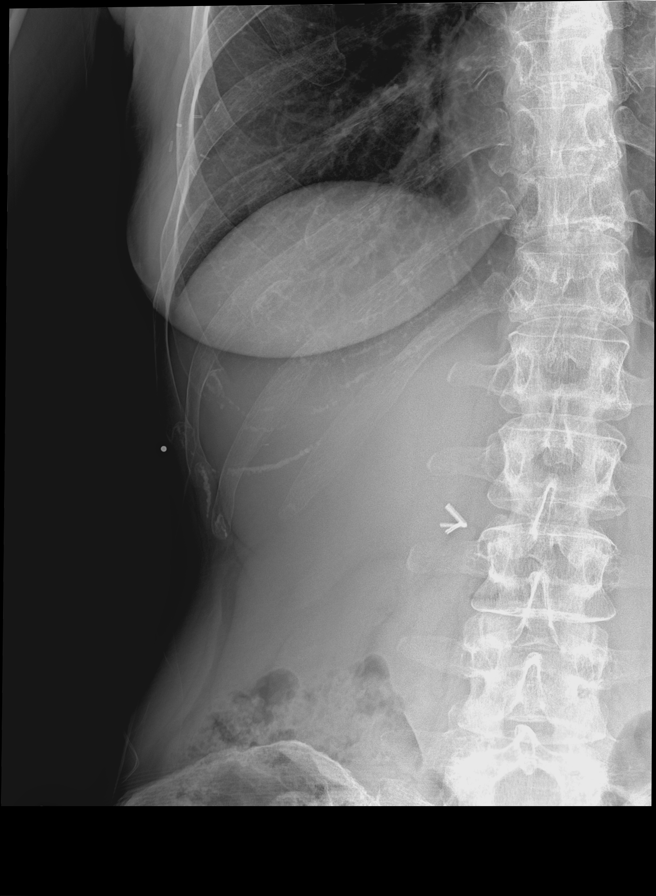

[4 of 4 positions shown; findings below may reference images not displayed]

DIAGNOSTIC STUDIES

EXAM

XR ribs RT w/ PA chest

INDICATION

rib pain
Right rib pain. Patient states that she was digging into [employer] as she thought she disposed of
wrong item and Stik Josue Plmg into her ribs causing pain. TB/HB

TECHNIQUE

AP view of the chest and 3 oblique views of the right ribs was performed

COMPARISONS

None

FINDINGS

The heart and mediastinum are unremarkable. There is bilateral peribronchial thickening. There is
hyperexpansion of the lungs. No gross pleural effusion or pneumothorax. No right rib fracture.

IMPRESSION
1. Bilateral peribronchial thickening.
2. Hyperexpansion of the lungs.
3. No evidence of right rib fracture.

Tech Notes:

Right rib pain. Patient states that she was digging into [employer] as she thought she disposed of
wrong item and Stik Josue Plmg into her ribs causing pain. TB/HB

## 2023-02-25 ENCOUNTER — Encounter: Admit: 2023-02-25 | Discharge: 2023-02-25 | Payer: BC Managed Care – PPO

## 2023-02-26 ENCOUNTER — Encounter: Admit: 2023-02-26 | Discharge: 2023-02-26 | Payer: BC Managed Care – PPO

## 2023-02-28 ENCOUNTER — Ambulatory Visit: Admit: 2023-02-28 | Discharge: 2023-02-28 | Payer: BC Managed Care – PPO

## 2023-02-28 ENCOUNTER — Encounter: Admit: 2023-02-28 | Discharge: 2023-02-28 | Payer: BC Managed Care – PPO

## 2023-02-28 DIAGNOSIS — J309 Allergic rhinitis, unspecified: Secondary | ICD-10-CM

## 2023-02-28 NOTE — Progress Notes
New IT vials made. A) 1:1 - 16 doses, 1:10 - 9 doses, 1:100 - 5 doses, 1:1000 - 4 doses. B) 1:1 - 16 doses, 1:10 - 9 doses, 1:100 - 5 doses, 1:1000 - 4 doses. C) 1:1 - 16 doses, 1:10 - 9 doses, 1:100 - 5 doses, 1:1000 - 4 doses.     Pt billed for 102 doses

## 2023-02-28 NOTE — Telephone Encounter
Mailed to:  Primary Care at [facility]  [address] [place] [address]     [employer] Tracking:[id number]    Envelope includes protocol, shot administration record, and 12 vials

## 2023-03-05 ENCOUNTER — Encounter: Admit: 2023-03-05 | Discharge: 2023-03-05 | Payer: BC Managed Care – PPO

## 2023-03-05 NOTE — Telephone Encounter
Nurse from Dr. Thomasene Lot office called to clarify AIT dosing. Reviewed the protocol.   She voiced understanding and had no further questions.

## 2023-03-13 ENCOUNTER — Encounter: Admit: 2023-03-13 | Discharge: 2023-03-13 | Payer: BC Managed Care – PPO

## 2023-04-29 ENCOUNTER — Encounter: Admit: 2023-04-29 | Discharge: 2023-04-29 | Payer: BC Managed Care – PPO

## 2023-04-29 MED ORDER — TAMOXIFEN 20 MG PO TAB
20 mg | ORAL_TABLET | Freq: Every day | ORAL | 0 refills
Start: 2023-04-29 — End: ?

## 2023-04-30 ENCOUNTER — Encounter: Admit: 2023-04-30 | Discharge: 2023-04-30 | Payer: BC Managed Care – PPO

## 2023-06-28 ENCOUNTER — Encounter: Admit: 2023-06-28 | Discharge: 2023-06-28 | Payer: BC Managed Care – PPO

## 2023-07-02 ENCOUNTER — Encounter: Admit: 2023-07-02 | Discharge: 2023-07-02 | Payer: BC Managed Care – PPO

## 2023-07-02 ENCOUNTER — Ambulatory Visit: Admit: 2023-07-02 | Discharge: 2023-07-03 | Payer: BC Managed Care – PPO

## 2023-07-02 DIAGNOSIS — J302 Other seasonal allergic rhinitis: Secondary | ICD-10-CM

## 2023-07-02 DIAGNOSIS — R519 Generalized headaches: Secondary | ICD-10-CM

## 2023-07-02 DIAGNOSIS — G43909 Migraine, unspecified, not intractable, without status migrainosus: Secondary | ICD-10-CM

## 2023-07-02 DIAGNOSIS — J309 Allergic rhinitis, unspecified: Secondary | ICD-10-CM

## 2023-07-02 DIAGNOSIS — K21 Bile reflux esophagitis: Secondary | ICD-10-CM

## 2023-07-02 DIAGNOSIS — E162 Hypoglycemia, unspecified: Secondary | ICD-10-CM

## 2023-07-02 DIAGNOSIS — Z923 Personal history of irradiation: Secondary | ICD-10-CM

## 2023-07-02 DIAGNOSIS — K2210 Ulcer of esophagus without bleeding: Secondary | ICD-10-CM

## 2023-07-02 DIAGNOSIS — F419 Anxiety disorder, unspecified: Secondary | ICD-10-CM

## 2023-07-02 DIAGNOSIS — C50911 Malignant neoplasm of unspecified site of right female breast: Secondary | ICD-10-CM

## 2023-07-02 DIAGNOSIS — K219 Gastro-esophageal reflux disease without esophagitis: Secondary | ICD-10-CM

## 2023-07-02 DIAGNOSIS — L989 Disorder of the skin and subcutaneous tissue, unspecified: Secondary | ICD-10-CM

## 2023-07-02 DIAGNOSIS — J329 Chronic sinusitis, unspecified: Secondary | ICD-10-CM

## 2023-07-02 DIAGNOSIS — Z1371 Encounter for nonprocreative screening for genetic disease carrier status: Secondary | ICD-10-CM

## 2023-07-02 DIAGNOSIS — G478 Other sleep disorders: Secondary | ICD-10-CM

## 2023-07-02 DIAGNOSIS — C50411 Malignant neoplasm of upper-outer quadrant of right female breast: Secondary | ICD-10-CM

## 2023-07-02 DIAGNOSIS — L509 Urticaria, unspecified: Secondary | ICD-10-CM

## 2023-07-02 DIAGNOSIS — J301 Allergic rhinitis due to pollen: Secondary | ICD-10-CM

## 2023-07-02 DIAGNOSIS — M549 Dorsalgia, unspecified: Secondary | ICD-10-CM

## 2023-07-02 DIAGNOSIS — T7840XA Allergy, unspecified, initial encounter: Secondary | ICD-10-CM

## 2023-07-02 DIAGNOSIS — M1990 Unspecified osteoarthritis, unspecified site: Secondary | ICD-10-CM

## 2023-07-02 DIAGNOSIS — H1010 Acute atopic conjunctivitis, unspecified eye: Secondary | ICD-10-CM

## 2023-07-02 MED ORDER — BUDESONIDE-FORMOTEROL 80-4.5 MCG/ACTUATION IN HFAA
2 | Freq: Two times a day (BID) | RESPIRATORY_TRACT | 3 refills | 30.00000 days | Status: AC
Start: 2023-07-02 — End: ?

## 2023-07-02 NOTE — Patient Instructions
Victoria Mcknight's  Asthma Action Plan  07/02/2023  In General:   1) Eliminate exposure to your allergens and tobacco smoke   2) Rinse out your mouth after using any inhaled medication   3) Use your spacer device with all 'HFA' medications   4) Get your flu shot every year    5) Call and ask questions, especially if you change zones, below    Helpful phone numbers:   During usual office hours: Our nurse can be reached at [phone] during open business hours.   After usual business hours: Call the hospital operator at [phone] and ask to speak with the Allergy fellow on-call.           ** Chest symptoms include one or more of the following: cough, wheeze, chest tightness and shortness of breath**    GREEN ZONE:  When:   1) Generally feeling fine and   2) Needing albuterol no more than 2 days per week.  3) Chest symptoms lasting no more than 2 days a week.  If you have chest symptoms, take:   Symbicort 2 puffs every 4 hours, as needed   Symbicort 2 puffs 15-30 minutes prior to scheduled exercise, if needed    YELLOW ZONE:  When:   1) Chest symptoms lasting more than 2 days a week    2) Coughing or wheezing at night and waking up.  3) Cannot do usual activities because of asthma, or missing school or work because of asthma.   4) Start at the first sign of a cold virus, bronchitis, or flu symptoms or   5) Two days before entering an unfriendly environment or   6) If needing Symbicort more than twice per week because of chest symptoms  How long:   At least 2 weeks and through 1 week of feeling well.  If you have chest symptoms, take:   Symbicort 2 puffs every 4 hours, as needed   Symbicort 2 puffs 15-30 minutes prior to scheduled exercise, if needed  Regardless of chest symptoms, take these 'controller' medications:   Symbicort 2 puffs twice daily. Continue for 7 days after return to green zone.     RED ZONE:   Chest symptoms are not controlled by the Yellow Zone medications.  What to do:   Symbicort HFA 4-6 puffs inhaled every 30 minutes, as needed   Go directly to the nearest Emergency Room for treatment or call 911 for a life-threatening attack.

## 2023-07-03 ENCOUNTER — Encounter: Admit: 2023-07-03 | Discharge: 2023-07-03 | Payer: BC Managed Care – PPO

## 2023-07-03 DIAGNOSIS — R0789 Other chest pain: Secondary | ICD-10-CM

## 2023-07-03 NOTE — Telephone Encounter
Called [phone] and LVM with [facility] for Dr.Norma Green.

## 2023-07-03 NOTE — Telephone Encounter
Spoke with Dr.Greens nurse to clarify dosing. Nurse informed me that the patient in currently on the yellow 1:10 vial and she just wrote the wrong strength down on the paper. Told the nurse to call the office if she has any questions in the future.     Routing to Dr.Sitek

## 2023-07-03 NOTE — Telephone Encounter
Tried to call Dr.Greens office to clarify pt allergy shot dosing but was left on hold. Will call back later

## 2023-07-07 ENCOUNTER — Encounter: Admit: 2023-07-07 | Discharge: 2023-07-07 | Payer: BC Managed Care – PPO

## 2023-07-24 ENCOUNTER — Encounter: Admit: 2023-07-24 | Discharge: 2023-07-24 | Payer: BC Managed Care – PPO

## 2023-07-30 ENCOUNTER — Encounter: Admit: 2023-07-30 | Discharge: 2023-07-30 | Payer: BC Managed Care – PPO

## 2023-07-30 MED ORDER — AZELASTINE-FLUTICASONE 137-50 MCG/SPRAY NA SPRY
0 refills
Start: 2023-07-30 — End: ?

## 2023-08-21 ENCOUNTER — Encounter: Admit: 2023-08-21 | Discharge: 2023-08-21 | Payer: Commercial Managed Care - PPO

## 2023-08-27 ENCOUNTER — Encounter: Admit: 2023-08-27 | Discharge: 2023-08-27 | Payer: BC Managed Care – PPO

## 2023-09-23 ENCOUNTER — Encounter: Admit: 2023-09-23 | Discharge: 2023-09-23 | Payer: BC Managed Care – PPO

## 2023-09-23 ENCOUNTER — Encounter: Admit: 2023-09-23 | Discharge: 2023-09-23 | Payer: Commercial Managed Care - PPO

## 2023-09-23 NOTE — Progress Notes
Allergy Immunotherapy Disposal Documentation  1:1000, 1:100expired vials disposed of in the appropriate container.   Patient does not follow up with primary allergist: Dr.Sitek  Comments: Pt no longer under providers care    Valentina Lucks, RN

## 2023-11-04 ENCOUNTER — Encounter: Admit: 2023-11-04 | Discharge: 2023-11-04 | Payer: Commercial Managed Care - PPO

## 2023-11-28 ENCOUNTER — Encounter: Admit: 2023-11-28 | Discharge: 2023-11-28 | Payer: Commercial Managed Care - PPO

## 2023-12-04 ENCOUNTER — Encounter: Admit: 2023-12-04 | Discharge: 2023-12-04 | Payer: Commercial Managed Care - PPO

## 2023-12-09 ENCOUNTER — Encounter: Admit: 2023-12-09 | Discharge: 2023-12-09 | Payer: Commercial Managed Care - PPO

## 2023-12-10 ENCOUNTER — Encounter: Admit: 2023-12-10 | Discharge: 2023-12-10 | Payer: Commercial Managed Care - PPO

## 2023-12-24 ENCOUNTER — Encounter: Admit: 2023-12-24 | Discharge: 2023-12-24 | Payer: Commercial Managed Care - PPO

## 2023-12-31 ENCOUNTER — Encounter: Admit: 2023-12-31 | Discharge: 2023-12-31 | Payer: Commercial Managed Care - PPO

## 2024-02-12 ENCOUNTER — Encounter: Admit: 2024-02-12 | Discharge: 2024-02-12 | Payer: PRIVATE HEALTH INSURANCE

## 2024-02-12 NOTE — Progress Notes
 Allergy Immunotherapy Disposal Documentation  3 1:10, 3 1:1 expired vials disposed of in the appropriate container.   Patient does have/does not follow up with primary allergist: Does not have follow up appt scheduled  Comments:   - Pt has decided to stop allergy shots  Lovett Ruck, LPN

## 2024-05-23 ENCOUNTER — Encounter: Admit: 2024-05-23 | Discharge: 2024-05-23 | Payer: PRIVATE HEALTH INSURANCE

## 2024-05-25 ENCOUNTER — Encounter: Admit: 2024-05-25 | Discharge: 2024-05-25 | Payer: PRIVATE HEALTH INSURANCE

## 2024-06-30 ENCOUNTER — Encounter: Admit: 2024-06-30 | Discharge: 2024-06-30 | Payer: PRIVATE HEALTH INSURANCE

## 2024-07-02 ENCOUNTER — Encounter: Admit: 2024-07-02 | Discharge: 2024-07-02 | Payer: PRIVATE HEALTH INSURANCE

## 2024-08-19 ENCOUNTER — Encounter: Admit: 2024-08-19 | Discharge: 2024-08-19 | Payer: PRIVATE HEALTH INSURANCE

## 2024-08-19 MED ORDER — TAMOXIFEN 20 MG PO TAB
20 mg | ORAL_TABLET | Freq: Every day | ORAL | 0 refills | 30.00000 days | Status: AC
Start: 2024-08-19 — End: ?

## 2024-10-30 ENCOUNTER — Encounter: Admit: 2024-10-30 | Discharge: 2024-10-30 | Payer: PRIVATE HEALTH INSURANCE

## 2024-11-17 ENCOUNTER — Encounter: Admit: 2024-11-17 | Discharge: 2024-11-17 | Payer: PRIVATE HEALTH INSURANCE

## 2024-11-17 MED ORDER — TAMOXIFEN 20 MG PO TAB
20 mg | ORAL_TABLET | Freq: Every day | ORAL | 1 refills | 30.00000 days | Status: AC
Start: 2024-11-17 — End: ?
